# Patient Record
Sex: Female | Born: 1960 | Race: Black or African American | Hispanic: No | Smoking: Never smoker
Health system: Southern US, Community
[De-identification: ages and names within clinical notes are randomized; demographics above are authoritative.]

## PROBLEM LIST (undated history)

## (undated) DIAGNOSIS — Z8739 Personal history of other diseases of the musculoskeletal system and connective tissue: Secondary | ICD-10-CM

## (undated) DIAGNOSIS — K219 Gastro-esophageal reflux disease without esophagitis: Secondary | ICD-10-CM

## (undated) DIAGNOSIS — I10 Essential (primary) hypertension: Secondary | ICD-10-CM

## (undated) DIAGNOSIS — F419 Anxiety disorder, unspecified: Secondary | ICD-10-CM

## (undated) DIAGNOSIS — D649 Anemia, unspecified: Secondary | ICD-10-CM

## (undated) HISTORY — DX: Anemia, unspecified: D64.9

## (undated) HISTORY — PX: TUBAL LIGATION: SHX77

## (undated) HISTORY — DX: Essential (primary) hypertension: I10

## (undated) HISTORY — PX: OTHER SURGICAL HISTORY: SHX169

## (undated) HISTORY — DX: Anxiety disorder, unspecified: F41.9

---

## 2001-04-06 ENCOUNTER — Encounter: Admission: RE | Admit: 2001-04-06 | Discharge: 2001-07-05 | Payer: Self-pay | Admitting: Family Medicine

## 2006-06-02 ENCOUNTER — Ambulatory Visit (HOSPITAL_BASED_OUTPATIENT_CLINIC_OR_DEPARTMENT_OTHER): Admission: RE | Admit: 2006-06-02 | Discharge: 2006-06-02 | Payer: Self-pay | Admitting: Surgery

## 2012-04-17 ENCOUNTER — Other Ambulatory Visit (HOSPITAL_COMMUNITY): Payer: Self-pay | Admitting: Physician Assistant

## 2012-04-17 DIAGNOSIS — Z1231 Encounter for screening mammogram for malignant neoplasm of breast: Secondary | ICD-10-CM

## 2012-04-30 ENCOUNTER — Ambulatory Visit (HOSPITAL_COMMUNITY)
Admission: RE | Admit: 2012-04-30 | Discharge: 2012-04-30 | Disposition: A | Discharge status: Home / Self Care | Payer: Self-pay | Source: Ambulatory Visit | Attending: Physician Assistant | Admitting: Physician Assistant

## 2012-04-30 DIAGNOSIS — Z1231 Encounter for screening mammogram for malignant neoplasm of breast: Secondary | ICD-10-CM

## 2013-04-10 ENCOUNTER — Other Ambulatory Visit (HOSPITAL_COMMUNITY): Payer: Self-pay | Admitting: Physician Assistant

## 2013-04-10 DIAGNOSIS — Z139 Encounter for screening, unspecified: Secondary | ICD-10-CM

## 2013-04-15 ENCOUNTER — Ambulatory Visit (HOSPITAL_COMMUNITY)
Admission: RE | Admit: 2013-04-15 | Discharge: 2013-04-15 | Disposition: A | Discharge status: Home / Self Care | Payer: Self-pay | Source: Ambulatory Visit | Attending: Physician Assistant | Admitting: Physician Assistant

## 2013-04-15 DIAGNOSIS — Z139 Encounter for screening, unspecified: Secondary | ICD-10-CM

## 2013-09-24 ENCOUNTER — Other Ambulatory Visit (HOSPITAL_COMMUNITY): Payer: Self-pay | Admitting: Physician Assistant

## 2013-09-24 DIAGNOSIS — N920 Excessive and frequent menstruation with regular cycle: Secondary | ICD-10-CM

## 2013-09-27 ENCOUNTER — Ambulatory Visit (HOSPITAL_COMMUNITY)
Admission: RE | Admit: 2013-09-27 | Discharge: 2013-09-27 | Disposition: A | Discharge status: Home / Self Care | Payer: Self-pay | Source: Ambulatory Visit | Attending: Physician Assistant | Admitting: Physician Assistant

## 2013-09-27 DIAGNOSIS — N920 Excessive and frequent menstruation with regular cycle: Secondary | ICD-10-CM

## 2013-10-21 ENCOUNTER — Encounter: Payer: Self-pay | Admitting: Obstetrics and Gynecology

## 2013-11-07 ENCOUNTER — Encounter: Payer: Self-pay | Admitting: Obstetrics and Gynecology

## 2013-11-07 ENCOUNTER — Encounter (INDEPENDENT_AMBULATORY_CARE_PROVIDER_SITE_OTHER): Payer: Self-pay

## 2013-11-07 ENCOUNTER — Ambulatory Visit (INDEPENDENT_AMBULATORY_CARE_PROVIDER_SITE_OTHER): Payer: Self-pay | Admitting: Obstetrics and Gynecology

## 2013-11-07 VITALS — BP 178/60 | Ht 66.0 in | Wt 339.4 lb

## 2013-11-07 DIAGNOSIS — D259 Leiomyoma of uterus, unspecified: Secondary | ICD-10-CM

## 2013-11-07 DIAGNOSIS — N925 Other specified irregular menstruation: Secondary | ICD-10-CM

## 2013-11-07 DIAGNOSIS — N938 Other specified abnormal uterine and vaginal bleeding: Secondary | ICD-10-CM

## 2013-11-07 DIAGNOSIS — N949 Unspecified condition associated with female genital organs and menstrual cycle: Secondary | ICD-10-CM

## 2013-11-07 NOTE — Patient Instructions (Addendum)
Stop megace after thanksgivivng Expect a period Return 4 wk for endometrial biopsy Endometrial Biopsy Endometrial biopsy is a procedure in which a tissue sample is taken from inside the uterus. The tissue sample is then looked at under a microscope to see if the tissue is normal or abnormal. The endometrium is the lining of the uterus. This procedure helps determine where you are in your menstrual cycle and how hormone levels are affecting the lining of the uterus. This procedure may also be used to evaluate uterine bleeding or to diagnose endometrial cancer, tuberculosis, polyps, or inflammatory conditions.  LET [facility] CARE PROVIDER KNOW ABOUT:  Any allergies you have.  All medicines you are taking, including vitamins, herbs, eye drops, creams, and over-the-counter medicines.  Previous problems you or members of your family have had with the use of anesthetics.  Any blood disorders you have.  Previous surgeries you have had.  Medical conditions you have.  Possibility of pregnancy. RISKS AND COMPLICATIONS Generally, this is a safe procedure. However, as with any procedure, complications can occur. Possible complications include:  Bleeding.  Pelvic infection.  Puncture of the uterine wall with the biopsy device (rare). BEFORE THE PROCEDURE   Keep a record of your menstrual cycles as directed by your health care provider. You may need to schedule your procedure for a specific time in your cycle.  You may want to bring a sanitary pad to wear home after the procedure.  Arrange for someone to drive you home after the procedure if you will be given a medicine to help you relax (sedative). PROCEDURE   You may be given a sedative to relax you.  You will lie on an exam table with your feet and legs supported as in a pelvic exam.  Your health care provider will insert an instrument (speculum) into your vagina to see your cervix.  Your cervix will be cleansed with an antiseptic  solution. A medicine (local anesthetic) will be used to numb the cervix.  A forceps instrument (tenaculum) will be used to hold your cervix steady for the biopsy.  A thin, rodlike instrument (uterine sound) will be inserted through your cervix to determine the length of your uterus and the location where the biopsy sample will be removed.  A thin, flexible tube (catheter) will be inserted through your cervix and into the uterus. The catheter is used to collect the biopsy sample from your endometrial tissue.  The catheter and speculum will then be removed, and the tissue sample will be sent to a lab for examination. AFTER THE PROCEDURE  You will rest in a recovery area until you are ready to go home.  You may have mild cramping and a small amount of vaginal bleeding for a few days after the procedure. This is normal.  Make sure you find out how to get your test results. Document Released: 04/07/2005 Document Revised: 08/07/2013 Document Reviewed: 05/22/2013 [facility] Patient Information 2014 [place], [place].

## 2013-11-08 NOTE — Progress Notes (Signed)
Subjective:     Patient ID: Renee Vargas, female   DOB: October 09, 1961, 52 y.o.   MRN: [id number]  HPI  Review of Systems U/s from 10/10  CLINICAL DATA: Menorrhagia  EXAM:  TRANSABDOMINAL AND TRANSVAGINAL ULTRASOUND OF PELVIS  TECHNIQUE:  Both transabdominal and transvaginal ultrasound examinations of the  pelvis were performed. Transabdominal technique was performed for  global imaging of the pelvis including uterus, ovaries, adnexal  regions, and pelvic cul-de-sac. It was necessary to proceed with  endovaginal exam following the transabdominal exam to visualize the  endometrium and ovaries.  COMPARISON: None  FINDINGS:  Uterus  Measurements: 13.3 x 9.3 x 9.8 cm. Inhomogeneous in echotexture.  There is masslike enlargement at the uterine fundus with extensive  posterior shadowing that obscures detail. Estimated measurements of  a masslike area at the anterior uterine fundus are 4.9 x 4.6 x 4.3  cm. Probable intramural anterior mid body fibroid measures 2.1 x 1.6  x 1.3 cm. No fibroids or other mass visualized.  Endometrium  Thickness: Obscured by overlying shadowing. No focal abnormality  visualized.  Right ovary  Measurements: 5.7 x 4.6 x 4.1 cm. Not well visualized. Possible cyst  measuring 5.0 x 4.0 x 3.5 cm, internal architecture not  well-defined.  Left ovary  Measurements: Not visualized. No adnexal mass.  Other findings  No free fluid.  IMPRESSION:  Masslike shadowing enlargement of the uterine fundus. This may  represent adenomyosis or fibroids. Leiomyosarcoma could appear  similar. MRI is recommended for further evaluation for this and the  right ovary.  Electronically Signed  By: Christiana Pellant M.D.  On: 09/27/2013 11:37      Objective:   Physical Exam  Constitutional: She is oriented to person, place, and time.  Neck: Normal range of motion.  Cardiovascular: Normal rate.   Pulmonary/Chest: Effort normal.  Genitourinary:  Uterus enlarged,    Musculoskeletal: Normal range of motion.  Neurological: She is alert and oriented to person, place, and time.  Skin: Skin is warm.  Psychiatric: She has a normal mood and affect. Her behavior is normal.       Assessment:     Uterine fibroid     Plan:     Return for endometrial biopsy and discuss options

## 2013-12-05 ENCOUNTER — Telehealth: Payer: Self-pay | Admitting: Obstetrics and Gynecology

## 2013-12-05 NOTE — Telephone Encounter (Signed)
Left msg for pt to keep appointment as scheduled.

## 2013-12-05 NOTE — Telephone Encounter (Signed)
Pt states was given medication at last visit to stop vaginal bleeding, started light bleeding again 12/04/2013. Does pt need to restart medication to stop the bleeding or can she have the endometrial biopsy with vaginal bleeding?

## 2013-12-09 ENCOUNTER — Other Ambulatory Visit: Payer: Self-pay | Admitting: Obstetrics and Gynecology

## 2013-12-09 ENCOUNTER — Ambulatory Visit (INDEPENDENT_AMBULATORY_CARE_PROVIDER_SITE_OTHER): Payer: Self-pay | Admitting: Obstetrics and Gynecology

## 2013-12-09 ENCOUNTER — Encounter: Payer: Self-pay | Admitting: Obstetrics and Gynecology

## 2013-12-09 VITALS — BP 148/90 | Ht 66.0 in | Wt 334.0 lb

## 2013-12-09 DIAGNOSIS — N920 Excessive and frequent menstruation with regular cycle: Secondary | ICD-10-CM

## 2013-12-09 DIAGNOSIS — D259 Leiomyoma of uterus, unspecified: Secondary | ICD-10-CM

## 2013-12-09 DIAGNOSIS — Z3202 Encounter for pregnancy test, result negative: Secondary | ICD-10-CM

## 2013-12-09 LAB — POCT URINE PREGNANCY: Preg Test, Ur: NEGATIVE

## 2013-12-09 MED ORDER — MEGESTROL ACETATE 40 MG PO TABS: 40.0000 mg | ORAL_TABLET | Freq: Every day | ORAL | Status: DC

## 2013-12-09 NOTE — Progress Notes (Deleted)
Patient ID: Renee Vargas, female   DOB: Dec 06, 1961, 52 y.o.   MRN: [id number] . Renee Vargas is an 52 y.o. female.  She is here for endometrial biopsy, and discussion of surgical options re: fibroid enlargement of uterus. Had [facility]  Bleeding in Sept , bleeding thru clothes, placed on provera by Dr Jacquelin Hawking  at [facility].   Now, pt came off pills thanksgiving , had menses last Wednesday, now day 6 ,not heavy.  Pertinent Gynecological History: Menses: controlled by provera since sept menses Bleeding:  Contraception: tubal ligation DES exposure: unknown Blood transfusions: none Sexually transmitted diseases: no past history Previous GYN Procedures: none  Last mammogram: normal Date: 2014 Last pap: normal Date: 2014 at [facility] OB History: G2, P2   Menstrual History: Menarche age: 59  Patient's last menstrual period was 12/04/2013.    Past Medical History  Diagnosis Date  . Hypertension   . Anemia   . Anxiety     Past Surgical History  Procedure Laterality Date  . Tubal ligation    . Fatty mass removed Left     Family History  Problem Relation Age of Onset  . Hypertension Mother   . Hypertension Sister     Social History:  reports that she has never smoked. She has never used smokeless tobacco. She reports that she does not drink alcohol or use illicit drugs.  Allergies: No Known Allergies   (Not in a hospital admission)  ROS  Blood pressure 148/90, height 5\' 6"  (1.676 m), weight 334 lb (151.501 kg), last menstrual period 12/04/2013. Physical Exam  Constitutional: She appears well-developed and well-nourished.  Obese bmi over 50  HENT:  Head: Normocephalic and atraumatic.  Eyes: Pupils are equal, round, and reactive to light.  Neck: Normal range of motion.  Cardiovascular: Normal rate.   Respiratory: Effort normal.  GI: Soft.  Obesity limits exam.. Uterus 16 wk size  Genitourinary: Vagina normal.  Enlarged 16 wk souns to 12 cm on endo  bxEndometrial Biopsy: Patient given informed consent, signed copy in the chart, time out was performed. Time out taken. . The patient was placed in the lithotomy position and the cervix brought into view with sterile speculum.  Portio of cervix cleansed x 2 with betadine swabs.  A tenaculum was placed in the anterior lip of the cervix. The uterus was sounded for depth of *** cm,. Milex uterine Explora 3 mm was introduced to into the uterus, suction created,  and an endometrial sample was obtained. All equipment was removed and accounted for.   The patient tolerated the procedure well.    Patient given post procedure instructions.  Followup: by phone Endometrial Biopsy: Patient given informed consent, signed copy in the chart, time out was performed. Time12:00 out taken. . The patient was placed in the lithotomy position and the cervix brought into view with sterile speculum.  Portio of cervix cleansed x 2 with betadine swabs.  A tenaculum was placed in the anterior lip of the cervix. The uterus was sounded for depth of 12 cm,. Milex uterine Explora 3 mm was introduced to into the uterus, suction created,  and an endometrial sample was obtained. All equipment was removed and accounted for.   The patient tolerated the procedure well.    Patient given post procedure instructions.  Followup: ***     Results for orders placed in visit on 12/09/13 (from the past 24 hour(s))  POCT URINE PREGNANCY     Status: None  Collection Time    12/09/13 11:42 AM      Result Value Range   Preg Test, Ur Negative      No results found.  Assessment/Plan: Uterine fibroids Acute menorrhagia 2 to fibroids, resolved  Followup bx results Will follow menses for now, if pt develops heavy menses, will txs Megace 40 mg tid til bleeding stopped. Given rx for 45 megace 40 mg pills to use as directed.  Vick Filter V 12/09/2013, 11:45 AM

## 2013-12-09 NOTE — Patient Instructions (Signed)
Fibroids Fibroids are lumps (tumors) that can occur any place in a woman's body. These lumps are not cancerous. Fibroids vary in size, weight, and where they grow. HOME CARE  Do not take aspirin.  Write down the number of pads or tampons you use during your period. Tell your doctor. This can help determine the best treatment for you. GET HELP RIGHT AWAY IF:  You have pain in your lower belly (abdomen) that is not helped with medicine.  You have cramps that are not helped with medicine.  You have more bleeding between or during your period.  You feel lightheaded or pass out (faint).  Your lower belly pain gets worse. MAKE SURE YOU:  Understand these instructions.  Will watch your condition.  Will get help right away if you are not doing well or get worse. Document Released: 01/07/2011 Document Revised: 02/27/2012 Document Reviewed: 01/07/2011 [employer] Patient Information 2014 [employer].  

## 2013-12-10 ENCOUNTER — Telehealth: Payer: Self-pay

## 2013-12-10 MED ORDER — MEGESTROL ACETATE 20 MG PO TABS: 40.0000 mg | ORAL_TABLET | Freq: Three times a day (TID) | ORAL | Status: DC

## 2013-12-10 NOTE — Telephone Encounter (Signed)
Pt states Megestrol 20 mg is $5.00 at [facility].  Can Dr. Emelda Fear e-scribed the 20 mg tablet instead of the 40 mg due to cost.

## 2013-12-10 NOTE — Telephone Encounter (Signed)
rx changed to meet pt desire to send to [facility], and adjusted to 20 mg, [facility] tabs tid [facility].

## 2013-12-18 ENCOUNTER — Telehealth: Payer: Self-pay | Admitting: Obstetrics and Gynecology

## 2013-12-18 NOTE — Telephone Encounter (Signed)
Spoke with pt. Had a biopsy last Monday. Was put on Megace for bleeding. Pt states she is still bleeding, but not as bad as it was. What do you advise? Pt concerned that she is still bleeding a week later. Please advise. Thanks!!!

## 2013-12-19 NOTE — Telephone Encounter (Signed)
Call completed 12/19/13

## 2013-12-19 NOTE — Telephone Encounter (Signed)
Patient advised of normal tissue on endometrial biopsy, with "hormone effect" from the megace Pt reports that she is still bleeding lightly despite the megace, so we will d/c the megace, expect bleeding to increase but hopefully resolve within a week. Pt to call us if still bleeding in a week, would at that time Tx with Provera 10 mg x 14 days to provoke menstrual slough of endometrium. Pt is very ,motivated to lose weight before proceeding to hysterectomy, and is joining [employer] in [place]. Pt hopes to put off hyst until at least 10 % weight loss.(wt 330 lb)  F/u prn.

## 2013-12-27 ENCOUNTER — Ambulatory Visit (INDEPENDENT_AMBULATORY_CARE_PROVIDER_SITE_OTHER): Payer: Self-pay | Admitting: Obstetrics and Gynecology

## 2013-12-27 ENCOUNTER — Telehealth: Payer: Self-pay | Admitting: Obstetrics and Gynecology

## 2013-12-27 ENCOUNTER — Encounter: Payer: Self-pay | Admitting: Obstetrics and Gynecology

## 2013-12-27 VITALS — BP 120/76 | Ht 66.0 in | Wt 328.0 lb

## 2013-12-27 DIAGNOSIS — N925 Other specified irregular menstruation: Secondary | ICD-10-CM

## 2013-12-27 DIAGNOSIS — N949 Unspecified condition associated with female genital organs and menstrual cycle: Secondary | ICD-10-CM

## 2013-12-27 DIAGNOSIS — N898 Other specified noninflammatory disorders of vagina: Secondary | ICD-10-CM

## 2013-12-27 DIAGNOSIS — D259 Leiomyoma of uterus, unspecified: Secondary | ICD-10-CM

## 2013-12-27 DIAGNOSIS — N938 Other specified abnormal uterine and vaginal bleeding: Secondary | ICD-10-CM

## 2013-12-27 DIAGNOSIS — N939 Abnormal uterine and vaginal bleeding, unspecified: Secondary | ICD-10-CM

## 2013-12-27 LAB — POCT HEMOGLOBIN: Hemoglobin: 10.7 g/dL — AB (ref 12.2–16.2)

## 2013-12-27 MED ORDER — PHENTERMINE HCL 30 MG PO CAPS: 30.0000 mg | ORAL_CAPSULE | ORAL | Status: DC

## 2013-12-27 MED ORDER — MEGESTROL ACETATE 20 MG PO TABS: 40.0000 mg | ORAL_TABLET | Freq: Three times a day (TID) | ORAL | Status: DC

## 2013-12-27 NOTE — Telephone Encounter (Signed)
Spoke with pt. Pt still bleeding. Was told to stop Megace. Bleeding heavy at times. Appt scheduled for follow up today at 11:15. [place]

## 2013-12-27 NOTE — Patient Instructions (Signed)
Check BP daily when starting Phentermine, do not continue if BP increases Restart megace as we discussed

## 2013-12-27 NOTE — Progress Notes (Signed)
Patient ID: Karolyna A. Saephanh, female   DOB: 03-Dec-1961, 53 y.o.   MRN: [id number] Pt states she is still having the bleeding. Pt states that she has not stopped since 12-04-13.     [place] [facility] Visit  Patient name: Raileigh Sabater. Hassinger MRN [id number]  Date of birth: November 26, 1961  CC & HPI:  Itzamar Traynor. Utt is a 53 y.o. female presenting today for followup of bleeding with known fibroids, [facility].  Pt constantlyhaving to change pads. (cost, inconvenient), gets on her nerves Had endometrial biopsy, with normal result.   ROS:  Bleeding 'getting on nerves' No lightheaded. Husband being supportive , sex not an issue.  Pertinent History Reviewed:  Medical & Surgical Hx:  Reviewed: Significant for  Medications: Reviewed & Updated - see associated section Social History: Reviewed -  reports that she has never smoked. She has never used smokeless tobacco.  Objective Findings:  Vitals: BP 120/76  Ht 5\' 6"  (1.676 m)  Wt 328 lb (148.78 kg)  BMI 52.97 kg/m2  LMP 12/04/2013 hgb 10. 7 Physical Examination: General appearance - alert, well appearing, and in no distress Mental status - alert, oriented to person, place, and time Eyes - pupils equal and reactive, extraocular eye movements intact Abdomen - soft, nontender, nondistended, no masses or organomegaly pelvicl : good support, normal cervix, enlarged uterus 12-14 wk on exam limited by body habitus.        Light spotting present.   Assessment & Plan:   DUB due to a fibroid enlargement of uterus Plan  Wt loss Try to suppress with LUPRON and addback aygestin. Will need to apply for financial aid through company PT needs to be active and wt loss Will offer Phentermine. Pt to watch BP

## 2014-01-03 ENCOUNTER — Telehealth: Payer: Self-pay | Admitting: Obstetrics and Gynecology

## 2014-01-03 NOTE — Telephone Encounter (Signed)
PT calling in regards to an assistance form for the Lupron injection. Pt advised that I would discuss with Dr. Glo Herring as soon as he came in, and that I would call her back as soon as I spoke with him.

## 2014-01-03 NOTE — Telephone Encounter (Signed)
Pt aware form is being filled out and to come by office to fill out her portion and what things to bring with her to complete the application.

## 2014-01-06 ENCOUNTER — Telehealth: Payer: Self-pay | Admitting: Obstetrics and Gynecology

## 2014-01-06 MED ORDER — ESTRADIOL 2 MG PO TABS: 2.0000 mg | ORAL_TABLET | Freq: Two times a day (BID) | ORAL | Status: DC

## 2014-01-06 NOTE — Telephone Encounter (Signed)
PT came by the office and picked up her financial assistance form for her Lupron injection. I spoke with Dr. Glo Herring about the pt still having bleeding after being on megace and he added Estrace 2mg  tablet BID for 10 days. Pt informed of new medication and to continue to take megace 40 mg. Pt was also advised to call the office if the bleeding doesn't stop in a few days or if the bleeding gets worse.Pt verbalized understanding.

## 2014-01-06 NOTE — Telephone Encounter (Signed)
Pt states the megace is not working. Pt supposed to be taking 40mg  3 times daily. Last week pt took 80mg  3 times daily and that still didn't stop bleeding. I advised pt I would send the message to Dr. Glo Herring and ask what he wants to do.

## 2014-01-08 ENCOUNTER — Encounter: Payer: Self-pay | Admitting: *Deleted

## 2014-01-08 NOTE — Progress Notes (Unsigned)
Patient ID: Renee Vargas, female   DOB: [birth year], 53 y.o.   MRN: [id number] Pt came by office Monday and was given the Financial assistance form for her Lupron and she said she would fill out the rest and mail it.

## 2014-01-10 ENCOUNTER — Ambulatory Visit (INDEPENDENT_AMBULATORY_CARE_PROVIDER_SITE_OTHER): Payer: Self-pay | Admitting: Obstetrics and Gynecology

## 2014-01-10 VITALS — BP 120/82 | Ht 66.0 in | Wt 327.0 lb

## 2014-01-10 DIAGNOSIS — N920 Excessive and frequent menstruation with regular cycle: Secondary | ICD-10-CM

## 2014-01-10 LAB — POCT HEMOGLOBIN: Hemoglobin: 8.2 g/dL — AB (ref 12.2–16.2)

## 2014-01-10 MED ORDER — MEGESTROL ACETATE 20 MG PO TABS: 40.0000 mg | ORAL_TABLET | Freq: Three times a day (TID) | ORAL | Status: DC

## 2014-01-10 NOTE — Addendum Note (Signed)
Addended by: Jonnie Kind on: 01/10/2014 01:51 PM   Modules accepted: Orders

## 2014-01-10 NOTE — Progress Notes (Signed)
This chart was transcribed for Dr. Mallory Shirk by Ludger Nutting, ED scribe.    [facility] [facility] Visit  Patient name: Renee Vargas. Renee Vargas MRN [id number]  Date of birth: 1961-10-18  CC & HPI:  Renee Vargas. Depaoli is a 53 y.o. female presenting today for continued vaginal bleeding and Hgb check. She has been vaginally bleeding since December 04, 2013. She reports having no bleeding yesterday but states it returned this morning. She has been complaint with estradiol and Megace. She reports feeling mildly fatigued but is able to perform daily activities.   ROS:  Negative except indicated above.   Pertinent History Reviewed:  Medical & Surgical Hx:  Reviewed: Significant for   Past Medical History  Diagnosis Date  . Hypertension   . Anemia   . Anxiety     Past Surgical History  Procedure Laterality Date  . Tubal ligation    . Fatty mass removed Left     Medications: Reviewed & Updated - see associated section Social History: Reviewed -  reports that she has never smoked. She has never used smokeless tobacco.  Objective Findings:  Vitals: BP 120/82  Ht 5\' 6"  (1.676 m)  Wt 327 lb (148.326 kg)  BMI 52.80 kg/m2  Physical Examination: General appearance - alert, well appearing, and in no distress and oriented to person, place, and time Pelvic - examination not indicated   Assessment & Plan:   Persistent DUB  Uterine fibroids Anemia Plan:  Increase estradiol to 2 mg tid           Continue megace          F/u 5 d      Goal get Lupron approval asap, suppress endometrium til anemia recovers then surgery

## 2014-01-13 ENCOUNTER — Other Ambulatory Visit: Payer: Self-pay | Admitting: Obstetrics and Gynecology

## 2014-01-16 ENCOUNTER — Ambulatory Visit: Payer: Self-pay | Admitting: Obstetrics and Gynecology

## 2014-01-17 ENCOUNTER — Ambulatory Visit: Payer: Self-pay | Admitting: Obstetrics and Gynecology

## 2014-01-22 ENCOUNTER — Encounter: Payer: Self-pay | Admitting: Obstetrics and Gynecology

## 2014-01-22 ENCOUNTER — Ambulatory Visit (INDEPENDENT_AMBULATORY_CARE_PROVIDER_SITE_OTHER): Payer: Self-pay | Admitting: Obstetrics and Gynecology

## 2014-01-22 VITALS — BP 142/92 | Ht 66.0 in | Wt 322.0 lb

## 2014-01-22 DIAGNOSIS — N949 Unspecified condition associated with female genital organs and menstrual cycle: Secondary | ICD-10-CM

## 2014-01-22 DIAGNOSIS — N925 Other specified irregular menstruation: Secondary | ICD-10-CM

## 2014-01-22 DIAGNOSIS — D649 Anemia, unspecified: Secondary | ICD-10-CM

## 2014-01-22 DIAGNOSIS — N938 Other specified abnormal uterine and vaginal bleeding: Secondary | ICD-10-CM

## 2014-01-22 LAB — POCT HEMOGLOBIN: Hemoglobin: 7.9 g/dL — AB (ref 12.2–16.2)

## 2014-01-22 NOTE — Progress Notes (Signed)
This chart was scribed by [name], Medical Scribe, for Dr. Mallory Shirk on 01/22/14 at 11:14 AM. This chart was reviewed by Dr. Mallory Shirk and is accurate.    [facility] Visit  Patient name: Renee Vargas MRN [id number]  Date of birth: [birth year]  CC & HPI:  Renee Vargas is a 53 y.o. female presenting today for signature on surgery paperwork. Reports weight of 322lbs. On megace currently. Was on diet pills but stopped them due to constipation.   ROS:  + ongoing vaginal bleeding, but finally stoppedx 2 wk since using megace and estradiol  Pertinent History Reviewed:  Medical & Surgical Hx:  Reviewed: Significant for fibroid uterus Medications: Reviewed & Updated - see associated section Social History: Reviewed -  reports that she has never smoked. She has never used smokeless tobacco.  Objective Findings:  Vitals: BP 142/92  Ht 5\' 6"  (1.676 m)  Wt 322 lb (146.058 kg)  BMI 52.00 kg/m2  Physical Examination: Not indicated for this visit   Assessment & Plan:  Hg decreasing (7.9 today, down 8.2). Weight loss discussed. Will fax signed LUPRON papers for approval.

## 2014-02-19 ENCOUNTER — Encounter: Payer: Self-pay | Admitting: Obstetrics and Gynecology

## 2014-02-19 ENCOUNTER — Ambulatory Visit (INDEPENDENT_AMBULATORY_CARE_PROVIDER_SITE_OTHER): Payer: Self-pay | Admitting: Obstetrics and Gynecology

## 2014-02-19 VITALS — BP 150/80 | Ht 66.0 in | Wt 330.0 lb

## 2014-02-19 DIAGNOSIS — D219 Benign neoplasm of connective and other soft tissue, unspecified: Secondary | ICD-10-CM | POA: Insufficient documentation

## 2014-02-19 DIAGNOSIS — D259 Leiomyoma of uterus, unspecified: Secondary | ICD-10-CM

## 2014-02-19 DIAGNOSIS — Z3200 Encounter for pregnancy test, result unknown: Secondary | ICD-10-CM

## 2014-02-19 DIAGNOSIS — D649 Anemia, unspecified: Secondary | ICD-10-CM

## 2014-02-19 DIAGNOSIS — N920 Excessive and frequent menstruation with regular cycle: Secondary | ICD-10-CM | POA: Insufficient documentation

## 2014-02-19 DIAGNOSIS — Z3202 Encounter for pregnancy test, result negative: Secondary | ICD-10-CM

## 2014-02-19 LAB — POCT HEMOGLOBIN: Hemoglobin: 8.9 g/dL — AB (ref 12.2–16.2)

## 2014-02-19 LAB — POCT URINE PREGNANCY: Preg Test, Ur: NEGATIVE

## 2014-02-19 MED ORDER — LEUPROLIDE ACETATE (3 MONTH) 11.25 MG IM KIT
11.2500 mg | PACK | Freq: Once | INTRAMUSCULAR | Status: AC
  Administered 2014-02-19: 11.25 mg via INTRAMUSCULAR

## 2014-02-19 NOTE — Patient Instructions (Signed)
Get pap results delivered to our offfice from the [facility] clinic. If no pap on record, will need one prior to surgery

## 2014-02-19 NOTE — Progress Notes (Signed)
This chart was scribed by [name], Medical Scribe, for Dr. Mallory Shirk on 02/19/14 at 10:12 AM. This chart was reviewed by Dr. Mallory Shirk and is accurate.   Patient ID: Renee Vargas, female   DOB: 22-Jun-1961, 53 y.o.   MRN: [id number] Pt given Lupron in right deltoid. Lot number [id number] and exp.date 08/02/16. Pt tolerated well. Pt to discuss surgery with Dr. Glo Herring.    [place] [facility] Visit  Patient name: Renee Vargas. Maclellan MRN [id number]  Date of birth: 02-Jan-1961  CC & HPI:  Renee Vargas. Mullen is a 53 y.o. female presenting today for f/u to vaginal bleeding. Given Lupron injection this morning.  Still on iron pills, megace and estradiol. No megace taken this morning. Makes her feel bloated and is experiencing constipation. + 8 lb weight gain since last visit. Trying activa to help with constipation. Has not been very active due to the constipation.   ROS:  NO vaginal bleeding since January 2015 with megace and estradiol. S/p endometrial BX: BENIGN + 8 lb weight gain + bloating and constipation  Denies any other complaints  Pertinent History Reviewed:  Medical & Surgical Hx:  Reviewed: Significant for uterine fibroids Medications: Reviewed & Updated - see associated section Social History: Reviewed -  reports that she has never smoked. She has never used smokeless tobacco.  Objective Findings:  Vitals: BP 150/80  Ht 5\' 6"  (5.277 m)  Wt 330 lb (149.687 kg)  BMI 53.29 kg/m2  LMP 12/04/2013  Physical Examination: Not indicated for this visit   Assessment & Plan:  Uterine fibroids Menorrhagia  Anemia on Fe PAP needed request results from [place]: surgery planned for early May

## 2014-02-19 NOTE — Progress Notes (Signed)
Scheduled TAH BS per Dr. Glo Herring request for 04-22-14 @ 7:30am.

## 2014-02-24 ENCOUNTER — Telehealth: Payer: Self-pay | Admitting: Adult Health

## 2014-02-25 ENCOUNTER — Encounter: Payer: Self-pay | Admitting: Obstetrics and Gynecology

## 2014-02-25 NOTE — Telephone Encounter (Signed)
Phone call to pt who will begin Megace 40 mg tid til bleeding stops., then daily .

## 2014-02-25 NOTE — Telephone Encounter (Signed)
Pt states was given Lupron injection last week and is now having heavy bleeding were she is having to wear an overnight pad. Pt states was under the understanding that the Lupron was to stop the bleeding. Please advise.

## 2014-02-27 ENCOUNTER — Telehealth: Payer: Self-pay

## 2014-02-27 NOTE — Telephone Encounter (Signed)
Pt states having large clots and bleeding, taking Megace as Dr. Glo Herring prescribed with no improvement. Appointment made tomorrow morning with Dr. Glo Herring.

## 2014-02-28 ENCOUNTER — Ambulatory Visit (INDEPENDENT_AMBULATORY_CARE_PROVIDER_SITE_OTHER): Payer: Self-pay | Admitting: Obstetrics and Gynecology

## 2014-02-28 ENCOUNTER — Encounter: Payer: Self-pay | Admitting: Obstetrics and Gynecology

## 2014-02-28 VITALS — BP 130/84

## 2014-02-28 DIAGNOSIS — D649 Anemia, unspecified: Secondary | ICD-10-CM

## 2014-02-28 DIAGNOSIS — N920 Excessive and frequent menstruation with regular cycle: Secondary | ICD-10-CM | POA: Insufficient documentation

## 2014-02-28 DIAGNOSIS — D250 Submucous leiomyoma of uterus: Secondary | ICD-10-CM

## 2014-02-28 DIAGNOSIS — N939 Abnormal uterine and vaginal bleeding, unspecified: Secondary | ICD-10-CM

## 2014-02-28 DIAGNOSIS — D251 Intramural leiomyoma of uterus: Secondary | ICD-10-CM

## 2014-02-28 LAB — POCT HEMOGLOBIN: Hemoglobin: 8.6 g/dL — AB (ref 12.2–16.2)

## 2014-02-28 NOTE — Progress Notes (Signed)
This chart was scribed by [name], Medical Scribe, for Dr. Mallory Shirk on 02/28/14 at 9:17 AM. This chart was reviewed by Dr. Mallory Shirk and is accurate.    [facility] Visit  Patient name: Renee Vargas. Renee Vargas MRN [id number]  Date of birth: 05-22-1961  CC & HPI:  Renee Vargas is a 53 y.o. female presenting today for worsening of vaginal bleeding after Lupron injection last week. Friday she was wearing a panty liner. Saturday she had to wear a regular pad. Monday she had to wear overnight pads. Tuesday she passed a large clot and was changing pads every 15 minutes. Started on megace 40 mg Monday. Currently, the bleeding is still heavy but improved from Tuesday. +fatigue, no energy. Hgb has also dropped since last visit from 8.9 to 8.6. Was on estradiol but not currently.   ROS:  No vaginal bleeding since January 2015 with megace and estradiol until this past week. S/p endometrial BX: BENIGN +menorrhagia and fatigue Denies any other sxs  Pertinent History Reviewed:  Medical & Surgical Hx:  Reviewed: Significant for fibroid uterus Medications: Reviewed & Updated - see associated section Social History: Reviewed -  reports that she has never smoked. She has never used smokeless tobacco.  Objective Findings:  Vitals: BP 130/84  LMP 02/21/2014 Chaperone present for exam which was done with pt's permission Physical Examination: General appearance - alert, well appearing, and in no distress and oriented to person, place, and time Mental status - alert, oriented to person, place, and time, normal mood, behavior, speech, dress, motor activity, and thought processes Pelvic Exam: Blood present in the vaginal vault, No clots in the vaginal canal  uterus 14 weeks. U/s shows what appears to be a fibroid in endometrial cavity, 2.x 3 cm. No active clots at present.     Assessment & Plan:  A: 1. Uterine fibroids 2. Menorrhagia 3. Anemia on Fe  P: 1. Estradiol 2mg  bid  2.  Stop Megace 3. If bleeding continues thru weekend, will admit for IVpremarin, and move ahead toward hysterectomy ahead of previous schedule.

## 2014-02-28 NOTE — Patient Instructions (Addendum)
Cancel the megace. Take Estrace 2 to 3 times a day until bleeding is controlled. Please call if bleeding worsens. [phone] Emalea Mix  Continue iron Call Monday with report of weekend

## 2014-03-05 ENCOUNTER — Other Ambulatory Visit: Payer: Self-pay | Admitting: Obstetrics and Gynecology

## 2014-03-05 ENCOUNTER — Ambulatory Visit (INDEPENDENT_AMBULATORY_CARE_PROVIDER_SITE_OTHER): Payer: Self-pay | Admitting: Obstetrics and Gynecology

## 2014-03-05 ENCOUNTER — Encounter (HOSPITAL_COMMUNITY): Payer: Self-pay | Admitting: *Deleted

## 2014-03-05 ENCOUNTER — Observation Stay (HOSPITAL_COMMUNITY)
Admission: AD | Admit: 2014-03-05 | Discharge: 2014-03-06 | Disposition: A | Discharge status: Home / Self Care | Payer: Self-pay | Source: Ambulatory Visit | Attending: Obstetrics and Gynecology | Admitting: Obstetrics and Gynecology

## 2014-03-05 VITALS — BP 118/76

## 2014-03-05 DIAGNOSIS — D259 Leiomyoma of uterus, unspecified: Secondary | ICD-10-CM

## 2014-03-05 DIAGNOSIS — N920 Excessive and frequent menstruation with regular cycle: Secondary | ICD-10-CM

## 2014-03-05 DIAGNOSIS — D219 Benign neoplasm of connective and other soft tissue, unspecified: Secondary | ICD-10-CM

## 2014-03-05 DIAGNOSIS — D649 Anemia, unspecified: Secondary | ICD-10-CM | POA: Diagnosis present

## 2014-03-05 LAB — BASIC METABOLIC PANEL
BUN: 13 mg/dL (ref 6–23)
CO2: 23 mEq/L (ref 19–32)
Calcium: 9.1 mg/dL (ref 8.4–10.5)
Chloride: 97 mEq/L (ref 96–112)
Creatinine, Ser: 0.95 mg/dL (ref 0.50–1.10)
GFR calc Af Amer: 80 mL/min — ABNORMAL LOW (ref >90)
GFR calc non Af Amer: 69 mL/min — ABNORMAL LOW (ref >90)
Glucose, Bld: 87 mg/dL (ref 70–99)
Potassium: 3.2 mEq/L — ABNORMAL LOW (ref 3.7–5.3)
Sodium: 134 mEq/L — ABNORMAL LOW (ref 137–147)

## 2014-03-05 LAB — CBC
HCT: 26.9 % — ABNORMAL LOW (ref 36.0–46.0)
Hemoglobin: 8.1 g/dL — ABNORMAL LOW (ref 12.0–15.0)
MCH: 20.6 pg — ABNORMAL LOW (ref 26.0–34.0)
MCHC: 30.1 g/dL (ref 30.0–36.0)
MCV: 68.3 fL — ABNORMAL LOW (ref 78.0–100.0)
Platelets: 579 10*3/uL — ABNORMAL HIGH (ref 150–400)
RBC: 3.94 MIL/uL (ref 3.87–5.11)
RDW: 17.4 % — ABNORMAL HIGH (ref 11.5–15.5)
WBC: 13.1 10*3/uL — ABNORMAL HIGH (ref 4.0–10.5)

## 2014-03-05 LAB — ABO/RH: ABO/RH(D): O POS

## 2014-03-05 LAB — PROTIME-INR
INR: 1.09 (ref 0.00–1.49)
Prothrombin Time: 13.9 seconds (ref 11.6–15.2)

## 2014-03-05 LAB — POCT HEMOGLOBIN: Hemoglobin: 6.7 g/dL — AB (ref 12.2–16.2)

## 2014-03-05 LAB — PREPARE RBC (CROSSMATCH)

## 2014-03-05 MED ORDER — ZOLPIDEM TARTRATE 5 MG PO TABS: 5.0000 mg | ORAL_TABLET | Freq: Every evening | ORAL | Status: DC | PRN

## 2014-03-05 MED ORDER — SODIUM CHLORIDE 0.9 % IV SOLN
INTRAVENOUS | Status: DC
  Administered 2014-03-06: 07:00:00 via INTRAVENOUS

## 2014-03-05 MED ORDER — DIPHENHYDRAMINE HCL 25 MG PO CAPS
50.0000 mg | ORAL_CAPSULE | Freq: Once | ORAL | Status: AC
  Administered 2014-03-05: 50 mg via ORAL
  Filled 2014-03-05: qty 2

## 2014-03-05 MED ORDER — ONDANSETRON HCL 4 MG/2ML IJ SOLN: 4.0000 mg | Freq: Four times a day (QID) | INTRAMUSCULAR | Status: DC | PRN

## 2014-03-05 MED ORDER — ACETAMINOPHEN 325 MG PO TABS
650.0000 mg | ORAL_TABLET | ORAL | Status: DC | PRN
  Administered 2014-03-05: 650 mg via ORAL
  Filled 2014-03-05: qty 2

## 2014-03-05 MED ORDER — OXYCODONE-ACETAMINOPHEN 5-325 MG PO TABS
1.0000 | ORAL_TABLET | ORAL | Status: DC | PRN
  Filled 2014-03-05: qty 1

## 2014-03-05 MED ORDER — ONDANSETRON HCL 4 MG PO TABS: 4.0000 mg | ORAL_TABLET | Freq: Four times a day (QID) | ORAL | Status: DC | PRN

## 2014-03-05 NOTE — H&P (Signed)
  This chart was scribed by [name], Medical Scribe, for Dr. Mallory Shirk on 03/05/14 at 1:55 PM. This chart was reviewed by Dr. Mallory Shirk and is accurate.  Renee Vargas   Patient name: Renee Vargas. [place] MRN [id number] Date of birth: 1961/12/09  CC & HPI:   Renee Vargas. Hogen is a 53 y.o. female presenting today for a hgb check and f/u for vaginal bleeding. Pt was started on estradiol 2 mg BID during her last Vargas and her megace Rx was stopped. She reports that she has had continued bleeding thru the weekend. Hgb at 6.7 today.  ROS:   + menorrhagia  + fatigue  No other complaints  Pertinent History Reviewed:   Medical & Surgical Hx: Reviewed: Significant for anemia on Fe, uterine fibroids and menorrhagia  Medications: Reviewed & Updated - see associated section  Social History: Reviewed - reports that she has never smoked. She has never used smokeless tobacco.  Objective Findings:   Vitals: BP 118/76  LMP 02/21/2014  Physical Examination: Not done  Physical Examination: General appearance - alert, well appearing, and in no distress, oriented to person, place, and time, overweight, acyanotic, in no respiratory distress and anxious Mental status - alert, oriented to person, place, and time, depressed mood Eyes - pupils equal and reactive, extraocular eye movements intact Chest - clear to auscultation, no wheezes, rales or rhonchi, symmetric air entry Heart - normal rate and regular rhythm Abdomen - soft, nontender, nondistended, no masses or organomegaly Fibroid uterus 14-16 wk size on recent exam Pelvic - normal external genitalia, vulva, vagina, cervix, uterus and adnexa, VULVA: normal appearing vulva with no masses, tenderness or lesions, VAGINA: normal appearing vagina with normal color and discharge, no lesions, moderate vag bleeding Extremities - peripheral pulses normal, no pedal edema, no clubbing or cyanosis, no pedal edema noted  Assessment & Plan:   A:   1. Anemia  2. Menorrhagia  3. Uterine fibroids  4. S/p Lupron injection for fibroid supression P:  1.Admission for blood transfusion x3-4 and IV premarin  2. Surgery planned for next week Wednesday.

## 2014-03-05 NOTE — Progress Notes (Signed)
This chart was scribed by [name], Medical Scribe, for Dr. Mallory Shirk on 03/05/14 at 1:55 PM. This chart was reviewed by Dr. Mallory Shirk and is accurate.    [facility] Visit  Patient name: Renee Vargas. Basic MRN [id number]  Date of birth: February 27, 1961  CC & HPI:  Renee Vargas. Treaster is a 53 y.o. female presenting today for a hgb check and f/u for vaginal bleeding. Pt was started on estradiol 2 mg BID during her last visit and her megace Rx was stopped. She reports that she has had continued bleeding thru the weekend.  Hgb at 6.7 today.   ROS:  + menorrhagia + fatigue No other complaints  Pertinent History Reviewed:  Medical & Surgical Hx:  Reviewed: Significant for anemia on Fe, uterine fibroids and menorrhagia Medications: Reviewed & Updated - see associated section Social History: Reviewed -  reports that she has never smoked. She has never used smokeless tobacco.  Objective Findings:  Vitals: BP 118/76  LMP 02/21/2014  Physical Examination: Not done  Assessment & Plan:  A: 1. Anemia 2. Menorrhagia 3. Uterine fibroids   P: 1.Admission for blood transfusion x3-4  and IV premarin 2. Surgery planned for next week

## 2014-03-06 ENCOUNTER — Ambulatory Visit: Payer: Self-pay | Admitting: Obstetrics and Gynecology

## 2014-03-06 ENCOUNTER — Encounter (HOSPITAL_COMMUNITY): Payer: Self-pay

## 2014-03-06 LAB — HEMOGLOBIN AND HEMATOCRIT, BLOOD
HCT: 32.2 % — ABNORMAL LOW (ref 36.0–46.0)
Hemoglobin: 10.3 g/dL — ABNORMAL LOW (ref 12.0–15.0)

## 2014-03-06 LAB — TSH: TSH: 2.228 u[IU]/mL (ref 0.350–4.500)

## 2014-03-06 MED ORDER — PROMETHAZINE HCL 25 MG/ML IJ SOLN
12.5000 mg | INTRAMUSCULAR | Status: DC | PRN
  Filled 2014-03-06: qty 1

## 2014-03-06 MED ORDER — ESTRADIOL 2 MG PO TABS: 2.0000 mg | ORAL_TABLET | Freq: Two times a day (BID) | ORAL | Status: DC

## 2014-03-06 MED ORDER — ESTROGENS CONJUGATED 25 MG IJ SOLR
25.0000 mg | INTRAMUSCULAR | Status: AC
  Administered 2014-03-06 (×2): 25 mg via INTRAVENOUS
  Filled 2014-03-06 (×2): qty 25

## 2014-03-06 MED ORDER — PROMETHAZINE HCL 25 MG/ML IJ SOLN: 12.5000 mg | Freq: Once | INTRAMUSCULAR | Status: DC

## 2014-03-06 MED ORDER — ESTROGENS CONJUGATED 25 MG IJ SOLR: 25.0000 mg | INTRAMUSCULAR | Status: DC

## 2014-03-06 NOTE — Discharge Summary (Signed)
Physician Discharge Summary  Patient ID: Renee Vargas MRN: [id number] DOB/AGE: Aug 03, 1961 53 y.o.  Admit date: 03/05/2014 Discharge date: 03/06/2014  Admission Diagnoses:1. Anemia  2. Menorrhagia  3. Uterine fibroids  4. S/p Lupron injection for fibroid supression   Discharge Diagnoses: SAME , ANEMIA, CORRECTED Active Problems:   Anemia   Discharged Condition: good  Hospital Course: aDMITTED FOR TRANSFUSIONS, hGB 8 ON ADMIT, RECEIVED 3 UNITS WITH HGB 10+ after 3rd unit. Also given premarin IV, 25 mg x 2 doses. Will coordinate surgery for next week  Consults: None  Significant Diagnostic Studies: labs:  CBC    Component Value Date/Time   WBC 13.1* 03/05/2014 1841   RBC 3.94 03/05/2014 1841   HGB 10.3* 03/06/2014 1147   HGB 6.7* 03/05/2014 1341   HCT 32.2* 03/06/2014 1147   PLT 579* 03/05/2014 1841   MCV 68.3* 03/05/2014 1841   MCH 20.6* 03/05/2014 1841   MCHC 30.1 03/05/2014 1841   RDW 17.4* 03/05/2014 1841      Treatments: transfusions 3 units prbc  Discharge Exam: Blood pressure 121/76, pulse 74, temperature 98.3 F (36.8 C), temperature source Oral, resp. rate 19, height 5\' 6"  (1.676 m), weight 315 lb (142.883 kg), last menstrual period 02/21/2014, SpO2 99.00%. General appearance: alert, appears stated age and no distress GI: abnormal findings:  morbid obese  Disposition: Final discharge disposition not confirmed  Discharge Orders   Future Appointments Provider Department Dept Phone   03/06/2014 1:15 PM Jonnie Kind, MD [facility] [phone]   03/10/2014 8:00 AM [facility]Do[facility] Delton [place]ombes [facility] [phone]   03/20/2014 11:30 AM Jonnie Kind, MD [facility] [phone]   Future Orders Complete By Expires   Call MD for:  As directed    Scheduling Instructions:     Recurrence of bleeding heavy as a period   Comments:     Recurrent bleeding equal to a period   Diet - low sodium heart healthy  As directed    Discharge  instructions  As directed    Comments:     You will  Need to renew the crossmatch and recheck hgb on Monday or Tuesday. You should receive a call by Friday to schedule the preop visit.   Increase activity slowly  As directed        Medication List    STOP taking these medications       megestrol 20 MG tablet  Commonly known as:  MEGACE      TAKE these medications       ALPRAZolam 0.5 MG tablet  Commonly known as:  XANAX  Take 0.5 mg by mouth 3 (three) times daily as needed for anxiety.     amLODipine 5 MG tablet  Commonly known as:  NORVASC  Take 5 mg by mouth daily.     citalopram 40 MG tablet  Commonly known as:  CELEXA  Take 40 mg by mouth daily.     estradiol 2 MG tablet  Commonly known as:  ESTRACE  Take 1 tablet (2 mg total) by mouth 2 (two) times daily.     ferrous sulfate 325 (65 FE) MG tablet  Take 325 mg by mouth daily with breakfast.     phentermine 30 MG capsule  Take 1 capsule (30 mg total) by mouth every morning.     triamterene-hydrochlorothiazide 37.5-25 MG per tablet  Commonly known as:  MAXZIDE-25  Take 1 tablet by mouth daily.  SignedJonnie Kind 03/06/2014, 12:58 PM

## 2014-03-06 NOTE — H&P (Signed)
This chart was scribed by Christina Taylor, Medical Scribe, for Dr. Deana Krock on 03/05/14 at 1:55 PM. This chart was reviewed by Dr. Almalik Weissberg and is accurate.   [facility] Visit   Patient name: Renee Vargas MRN [id number] Date of birth: 03/04/1961   CC & HPI:   Renee Vargas is a 52 y.o. female presenting today for a hgb check and f/u for vaginal bleeding. Pt was started on estradiol 2 mg BID during her last visit and her megace Rx was stopped. She reports that she has had continued bleeding thru the weekend. Hgb at 6.7 today.   ROS:   + menorrhagia  + fatigue  No other complaints   Pertinent History Reviewed:   Medical & Surgical Hx: Reviewed: Significant for anemia on Fe, uterine fibroids and menorrhagia  Medications: Reviewed & Updated - see associated section  Social History: Reviewed - reports that she has never smoked. She has never used smokeless tobacco.   Objective Findings:   Vitals: BP 118/76  LMP 02/21/2014  Physical Examination: Not done  Physical Examination: General appearance - alert, well appearing, and in no distress, oriented to person, place, and time, overweight, acyanotic, in no respiratory distress and anxious  Mental status - alert, oriented to person, place, and time, depressed mood  Eyes - pupils equal and reactive, extraocular eye movements intact  Chest - clear to auscultation, no wheezes, rales or rhonchi, symmetric air entry  Heart - normal rate and regular rhythm  Abdomen - soft, nontender, nondistended, no masses or organomegaly  Fibroid uterus 14-16 wk size on recent exam  Pelvic - normal external genitalia, vulva, vagina, cervix, uterus and adnexa, VULVA: normal appearing vulva with no masses, tenderness or lesions, VAGINA: normal appearing vagina with normal color and discharge, no lesions, moderate vag bleeding  Extremities - peripheral pulses normal, no pedal edema, no clubbing or cyanosis, no pedal edema noted   Assessment &  Plan:   A:  1. Anemia  2. Menorrhagia  3. Uterine fibroids  4. S/p Lupron injection for fibroid supression  P:  1.Admission for blood transfusion x3-4 and IV premarin  2. Surgery planned for next week Wednesday.          

## 2014-03-06 NOTE — Progress Notes (Signed)
Patient with orders to be discharge home. Discharge instructions given, patient verbalized understanding. Patient stable. Patient left inj private vehicle with spouse.

## 2014-03-06 NOTE — Care Management Note (Signed)
UR completed 

## 2014-03-09 LAB — TYPE AND SCREEN
ABO/RH(D): O POS
Antibody Screen: NEGATIVE
Unit division: 0
Unit division: 0
Unit division: 0
Unit division: 0

## 2014-03-10 ENCOUNTER — Encounter (HOSPITAL_COMMUNITY): Payer: Self-pay

## 2014-03-10 ENCOUNTER — Encounter (HOSPITAL_COMMUNITY)
Admission: RE | Admit: 2014-03-10 | Discharge: 2014-03-10 | Disposition: A | Discharge status: Home / Self Care | Payer: Self-pay | Source: Ambulatory Visit | Attending: Obstetrics and Gynecology | Admitting: Obstetrics and Gynecology

## 2014-03-10 ENCOUNTER — Other Ambulatory Visit: Payer: Self-pay | Admitting: Obstetrics and Gynecology

## 2014-03-10 DIAGNOSIS — Z01810 Encounter for preprocedural cardiovascular examination: Secondary | ICD-10-CM | POA: Insufficient documentation

## 2014-03-10 DIAGNOSIS — Z01812 Encounter for preprocedural laboratory examination: Secondary | ICD-10-CM | POA: Insufficient documentation

## 2014-03-10 DIAGNOSIS — Z01818 Encounter for other preprocedural examination: Secondary | ICD-10-CM

## 2014-03-10 HISTORY — DX: Personal history of other diseases of the musculoskeletal system and connective tissue: Z87.39

## 2014-03-10 LAB — PREPARE RBC (CROSSMATCH)

## 2014-03-10 LAB — URINE MICROSCOPIC-ADD ON

## 2014-03-10 LAB — CBC
HCT: 34.4 % — ABNORMAL LOW (ref 36.0–46.0)
Hemoglobin: 10.8 g/dL — ABNORMAL LOW (ref 12.0–15.0)
MCH: 23.1 pg — ABNORMAL LOW (ref 26.0–34.0)
MCHC: 31.4 g/dL (ref 30.0–36.0)
MCV: 73.5 fL — ABNORMAL LOW (ref 78.0–100.0)
Platelets: 523 10*3/uL — ABNORMAL HIGH (ref 150–400)
RBC: 4.68 MIL/uL (ref 3.87–5.11)
RDW: 21.9 % — ABNORMAL HIGH (ref 11.5–15.5)
WBC: 10.2 10*3/uL (ref 4.0–10.5)

## 2014-03-10 LAB — COMPREHENSIVE METABOLIC PANEL
ALT: 39 U/L — ABNORMAL HIGH (ref 0–35)
AST: 36 U/L (ref 0–37)
Albumin: 3.1 g/dL — ABNORMAL LOW (ref 3.5–5.2)
Alkaline Phosphatase: 97 U/L (ref 39–117)
BUN: 12 mg/dL (ref 6–23)
CO2: 25 mEq/L (ref 19–32)
Calcium: 8.9 mg/dL (ref 8.4–10.5)
Chloride: 98 mEq/L (ref 96–112)
Creatinine, Ser: 0.96 mg/dL (ref 0.50–1.10)
GFR calc Af Amer: 79 mL/min — ABNORMAL LOW (ref >90)
GFR calc non Af Amer: 68 mL/min — ABNORMAL LOW (ref >90)
Glucose, Bld: 91 mg/dL (ref 70–99)
Potassium: 3.8 mEq/L (ref 3.7–5.3)
Sodium: 135 mEq/L — ABNORMAL LOW (ref 137–147)
Total Bilirubin: 0.3 mg/dL (ref 0.3–1.2)
Total Protein: 7.5 g/dL (ref 6.0–8.3)

## 2014-03-10 LAB — RPR: RPR Ser Ql: NONREACTIVE

## 2014-03-10 LAB — URINALYSIS, ROUTINE W REFLEX MICROSCOPIC
Bilirubin Urine: NEGATIVE
Glucose, UA: NEGATIVE mg/dL
Hgb urine dipstick: NEGATIVE
Ketones, ur: NEGATIVE mg/dL
Nitrite: NEGATIVE
Protein, ur: NEGATIVE mg/dL
Specific Gravity, Urine: 1.015 (ref 1.005–1.030)
Urobilinogen, UA: 0.2 mg/dL (ref 0.0–1.0)
pH: 6 (ref 5.0–8.0)

## 2014-03-10 LAB — HCG, SERUM, QUALITATIVE: Preg, Serum: NEGATIVE

## 2014-03-10 NOTE — Telephone Encounter (Signed)
Pt spoke with Caryl Pina

## 2014-03-10 NOTE — Patient Instructions (Addendum)
Renee Vargas  03/10/2014   Your procedure is scheduled on:  03/12/2014  Report to [facility] at  1045  AM.  Call this number if you have problems the morning of surgery: [phone]   Remember:   Do not eat food or drink liquids after midnight.   Take these medicines the morning of surgery with A SIP OF WATER:  Norvasc, celexa, maxzide   Do not wear jewelry, make-up or nail polish.  Do not wear lotions, powders, or perfumes.   Do not shave 48 hours prior to surgery. Men may shave face and neck.  Do not bring valuables to the hospital.  [facility] is not responsible   for any belongings or valuables.               Contacts, dentures or bridgework may not be worn into surgery.  Leave suitcase in the car. After surgery it may be brought to your room.  For patients admitted to the hospital, discharge time is determined by your  treatment team.               Patients discharged the day of surgery will not be allowed to drive home.  Name and phone number of your driver: family  Special Instructions: Shower using CHG 2 nights before surgery and the night before surgery.  If you shower the day of surgery use CHG.  Use special wash - you have one bottle of CHG for all showers.  You should use approximately 1/3 of the bottle for each shower.   Please read over the following fact sheets that you were given: Pain Booklet, Coughing and Deep Breathing, Surgical Site Infection Prevention, Anesthesia Post-op Instructions and Care and Recovery After Surgery Bilateral Salpingo-Oophorectomy Bilateral salpingo-oophorectomy is the surgical removal of both fallopian tubes and both ovaries. The ovaries are small organs that produce eggs in women. The fallopian tubes transport the egg from the ovary to the womb (uterus). Usually, when this surgery is done, the uterus was previously removed. A bilateral salpingo-oophorectomy may be done to treat cancer or to reduce the risk of cancer in women who  are at high risk. Removing both fallopian tubes and both ovaries will make you unable to become pregnant (sterile). It will also put you into menopause so that you will no longer have menstrual periods and may have menopausal symptoms such as hot flashes, night sweats, and mood changes. It will not affect your sex drive. LET [facility] CARE PROVIDER KNOW ABOUT:  Any allergies you have.  All medicines you are taking, including vitamins, herbs, eye drops, creams, and over-the-counter medicines.  Previous problems you or members of your family have had with the use of anesthetics.  Any blood disorders you have.  Previous surgeries you have had.  Medical conditions you have. RISKS AND COMPLICATIONS Generally, this is a safe procedure. However, as with any procedure, complications can occur. Possible complications include:  Injury to surrounding organs.  Bleeding.  Infection.  Blood clots in the legs or lungs.  Problems related to anesthesia. BEFORE THE PROCEDURE  Ask your health care provider about changing or stopping your regular medicines. You may need to stop taking certain medicines, such as aspirin or blood thinners, at least 1 week before the surgery.  Do not eat or drink anything for at least 8 hours before the surgery.  If you smoke, do not smoke for at least 2 weeks before the surgery.  Make  plans to have someone drive you home after the procedure or after your hospital stay. Also arrange for someone to help you with activities during recovery. PROCEDURE   You will be given medicine to help you relax before the procedure (sedative). You will then be given medicine to make you sleep through the procedure (general anesthetic). These medicines will be given through an IV access tube that is put into one of your veins.  Once you are asleep, your lower abdomen will be shaved and cleaned. A thin, flexible tube (catheter) will be placed in your bladder.  The surgeon may use  a laparoscopic, robotic, or open technique for this surgery:  In the laparoscopic technique, the surgery is done through two small cuts (incisions) in the abdomen. A thin, lighted tube with a tiny camera on the end (laparoscope) is inserted into one of the incisions. The tools needed for the procedure are put through the other incision.  A robotic technique may be chosen to perform complex surgery in a small space. In the robotic technique, small incisions will be made. A camera and surgical instruments are passed through the incisions. Surgical instruments will be controlled with the help of a robotic arm.  In the open technique, the surgery is done through one large incision in the abdomen.  Using any of these techniques, the surgeon removes the fallopian tubes and ovaries. The blood vessels will be clamped and tied.  The surgeon then uses staples or stitches to close the incision or incisions. AFTER THE PROCEDURE  You will be taken to a recovery area where you will be monitored for 1 to 3 hours. Your blood pressure, pulse, and temperature will be checked often. You will remain in the recovery area until you are stable and waking up.  If the laparoscopic technique was used, you may be allowed to go home after several hours. You may have some shoulder pain after the laparoscopic procedure. This is normal and usually goes away in a day or two.  If the open technique was used, you will be admitted to the hospital for a couple of days.  You will be given pain medicine as needed.  The IV access tube and catheter will be removed before you are discharged. Document Released: 12/05/2005 Document Revised: 08/07/2013 Document Reviewed: 05/29/2013 [facility] Patient Information 2014 [place]. Hysterectomy Information  A hysterectomy is a surgery in which your uterus is removed. This surgery may be done to treat various medical problems. After the surgery, you will no longer have menstrual  periods. The surgery will also make you unable to become pregnant (sterile). The fallopian tubes and ovaries can be removed (bilateral salpingo-oophorectomy) during this surgery as well.  REASONS FOR A HYSTERECTOMY  Persistent, abnormal bleeding.  Lasting (chronic) pelvic pain or infection.  The lining of the uterus (endometrium) starts growing outside the uterus (endometriosis).  The endometrium starts growing in the muscle of the uterus (adenomyosis).  The uterus falls down into the vagina (pelvic organ prolapse).  Noncancerous growths in the uterus (uterine fibroids) that cause symptoms.  Precancerous cells.  Cervical cancer or uterine cancer. TYPES OF HYSTERECTOMIES  Supracervical hysterectomy In this type, the top part of the uterus is removed, but not the cervix.  Total hysterectomy The uterus and cervix are removed.  Radical hysterectomy The uterus, the cervix, and the fibrous tissue that holds the uterus in place in the pelvis (parametrium) are removed. WAYS A HYSTERECTOMY CAN BE PERFORMED  Abdominal hysterectomy A large surgical  cut (incision) is made in the abdomen. The uterus is removed through this incision.  Vaginal hysterectomy An incision is made in the vagina. The uterus is removed through this incision. There are no abdominal incisions.  Conventional laparoscopic hysterectomy Three or four small incisions are made in the abdomen. A thin, lighted tube with a camera (laparoscope) is inserted into one of the incisions. Other tools are put through the other incisions. The uterus is cut into small pieces. The small pieces are removed through the incisions, or they are removed through the vagina.  Laparoscopically assisted vaginal hysterectomy (LAVH) Three or four small incisions are made in the abdomen. Part of the surgery is performed laparoscopically and part vaginally. The uterus is removed through the vagina.  Robot-assisted laparoscopic hysterectomy A laparoscope  and other tools are inserted into 3 or 4 small incisions in the abdomen. A computer-controlled device is used to give the surgeon a 3D image and to help control the surgical instruments. This allows for more precise movements of surgical instruments. The uterus is cut into small pieces and removed through the incisions or removed through the vagina. RISKS AND COMPLICATIONS  Possible complications associated with this procedure include:  Bleeding and risk of blood transfusion. Tell your health care provider if you do not want to receive any blood products.  Blood clots in the legs or lung.  Infection.  Injury to surrounding organs.  Problems or side effects related to anesthesia.  Conversion to an abdominal hysterectomy from one of the other techniques. WHAT TO EXPECT AFTER A HYSTERECTOMY  You will be given pain medicine.  You will need to have someone with you for the first 3 5 days after you go home.  You will need to follow up with your surgeon in 2 4 weeks after surgery to evaluate your progress.  You may have early menopause symptoms such as hot flashes, night sweats, and insomnia.  If you had a hysterectomy for a problem that was not cancer or not a condition that could lead to cancer, then you no longer need Pap tests. However, even if you no longer need a Pap test, a regular exam is a good idea to make sure no other problems are starting. Document Released: 05/31/2001 Document Revised: 09/25/2013 Document Reviewed: 08/12/2013 [facility] Patient Information 2014 [place]. PATIENT INSTRUCTIONS POST-ANESTHESIA  IMMEDIATELY FOLLOWING SURGERY:  Do not drive or operate machinery for the first twenty four hours after surgery.  Do not make any important decisions for twenty four hours after surgery or while taking narcotic pain medications or sedatives.  If you develop intractable nausea and vomiting or a severe headache please notify your doctor immediately.  FOLLOW-UP:  Please  make an appointment with your surgeon as instructed. You do not need to follow up with anesthesia unless specifically instructed to do so.  WOUND CARE INSTRUCTIONS (if applicable):  Keep a dry clean dressing on the anesthesia/puncture wound site if there is drainage.  Once the wound has quit draining you may leave it open to air.  Generally you should leave the bandage intact for twenty four hours unless there is drainage.  If the epidural site drains for more than 36-48 hours please call the anesthesia department.  QUESTIONS?:  Please feel free to call your physician or the hospital operator if you have any questions, and they will be happy to assist you.       Drink 1 bottle of Magnesium Citrate at noon on 03/11/2014. Drink plenty of fluids  behind this to make laxative work more effectively. Once you drink the laxative, only full liquids after this. NO SOLID FOOD. Liquids include jello, popsicles, broth. Any type of liquid except for milk or milk products.

## 2014-03-12 ENCOUNTER — Inpatient Hospital Stay (HOSPITAL_COMMUNITY)
Admission: RE | Admit: 2014-03-12 | Discharge: 2014-03-14 | DRG: 742 | Disposition: A | Discharge status: Home / Self Care | Payer: Self-pay | Source: Ambulatory Visit | Attending: Obstetrics and Gynecology | Admitting: Obstetrics and Gynecology

## 2014-03-12 ENCOUNTER — Encounter (HOSPITAL_COMMUNITY): Payer: Self-pay | Admitting: Anesthesiology

## 2014-03-12 ENCOUNTER — Encounter (HOSPITAL_COMMUNITY)
Admission: RE | Disposition: A | Discharge status: Home / Self Care | Payer: Self-pay | Source: Ambulatory Visit | Attending: Obstetrics and Gynecology

## 2014-03-12 ENCOUNTER — Inpatient Hospital Stay (HOSPITAL_COMMUNITY): Payer: Self-pay | Admitting: Anesthesiology

## 2014-03-12 ENCOUNTER — Encounter (HOSPITAL_COMMUNITY): Payer: Self-pay | Admitting: *Deleted

## 2014-03-12 DIAGNOSIS — D500 Iron deficiency anemia secondary to blood loss (chronic): Secondary | ICD-10-CM

## 2014-03-12 DIAGNOSIS — Z6841 Body Mass Index (BMI) 40.0 and over, adult: Secondary | ICD-10-CM

## 2014-03-12 DIAGNOSIS — Z90711 Acquired absence of uterus with remaining cervical stump: Secondary | ICD-10-CM | POA: Diagnosis present

## 2014-03-12 DIAGNOSIS — N840 Polyp of corpus uteri: Secondary | ICD-10-CM

## 2014-03-12 DIAGNOSIS — D649 Anemia, unspecified: Secondary | ICD-10-CM | POA: Diagnosis present

## 2014-03-12 DIAGNOSIS — N920 Excessive and frequent menstruation with regular cycle: Secondary | ICD-10-CM | POA: Diagnosis present

## 2014-03-12 DIAGNOSIS — N83209 Unspecified ovarian cyst, unspecified side: Secondary | ICD-10-CM | POA: Diagnosis present

## 2014-03-12 DIAGNOSIS — I10 Essential (primary) hypertension: Secondary | ICD-10-CM | POA: Diagnosis present

## 2014-03-12 DIAGNOSIS — D259 Leiomyoma of uterus, unspecified: Principal | ICD-10-CM | POA: Diagnosis present

## 2014-03-12 DIAGNOSIS — N9489 Other specified conditions associated with female genital organs and menstrual cycle: Secondary | ICD-10-CM

## 2014-03-12 DIAGNOSIS — D251 Intramural leiomyoma of uterus: Secondary | ICD-10-CM

## 2014-03-12 HISTORY — PX: ABDOMINAL HYSTERECTOMY: SHX81

## 2014-03-12 HISTORY — PX: SALPINGOOPHORECTOMY: SHX82

## 2014-03-12 SURGERY — HYSTERECTOMY, ABDOMINAL
Anesthesia: General

## 2014-03-12 MED ORDER — SIMETHICONE 80 MG PO CHEW: 80.0000 mg | CHEWABLE_TABLET | Freq: Four times a day (QID) | ORAL | Status: DC | PRN

## 2014-03-12 MED ORDER — HYDROMORPHONE 0.3 MG/ML IV SOLN
INTRAVENOUS | Status: DC
  Administered 2014-03-12: 17:00:00 via INTRAVENOUS
  Administered 2014-03-12: 3.6 mg via INTRAVENOUS
  Administered 2014-03-13 (×2): 0.6 mg via INTRAVENOUS
  Filled 2014-03-12: qty 25

## 2014-03-12 MED ORDER — KETOROLAC TROMETHAMINE 30 MG/ML IJ SOLN
30.0000 mg | Freq: Four times a day (QID) | INTRAMUSCULAR | Status: AC
  Administered 2014-03-13 – 2014-03-14 (×5): 30 mg via INTRAVENOUS
  Filled 2014-03-12 (×2): qty 1

## 2014-03-12 MED ORDER — CEFAZOLIN SODIUM-DEXTROSE 2-3 GM-% IV SOLR
INTRAVENOUS | Status: AC
  Filled 2014-03-12: qty 50

## 2014-03-12 MED ORDER — ACETAMINOPHEN 325 MG PO TABS: 650.0000 mg | ORAL_TABLET | ORAL | Status: DC | PRN

## 2014-03-12 MED ORDER — AMLODIPINE BESYLATE 5 MG PO TABS
5.0000 mg | ORAL_TABLET | Freq: Every day | ORAL | Status: DC
  Administered 2014-03-13 – 2014-03-14 (×2): 5 mg via ORAL
  Filled 2014-03-12 (×2): qty 1

## 2014-03-12 MED ORDER — ONDANSETRON HCL 4 MG/2ML IJ SOLN: 4.0000 mg | Freq: Four times a day (QID) | INTRAMUSCULAR | Status: DC | PRN

## 2014-03-12 MED ORDER — NALOXONE HCL 0.4 MG/ML IJ SOLN: 0.4000 mg | INTRAMUSCULAR | Status: DC | PRN

## 2014-03-12 MED ORDER — KETOROLAC TROMETHAMINE 30 MG/ML IJ SOLN
30.0000 mg | Freq: Four times a day (QID) | INTRAMUSCULAR | Status: AC
  Filled 2014-03-12 (×3): qty 1

## 2014-03-12 MED ORDER — ONDANSETRON HCL 4 MG/2ML IJ SOLN: 4.0000 mg | Freq: Once | INTRAMUSCULAR | Status: DC | PRN

## 2014-03-12 MED ORDER — ROCURONIUM BROMIDE 100 MG/10ML IV SOLN
INTRAVENOUS | Status: DC | PRN
  Administered 2014-03-12: 35 mg via INTRAVENOUS
  Administered 2014-03-12 (×2): 5 mg via INTRAVENOUS

## 2014-03-12 MED ORDER — GLYCOPYRROLATE 0.2 MG/ML IJ SOLN
INTRAMUSCULAR | Status: DC | PRN
  Administered 2014-03-12: .8 mg via INTRAVENOUS

## 2014-03-12 MED ORDER — PROPOFOL 10 MG/ML IV BOLUS
INTRAVENOUS | Status: DC | PRN
  Administered 2014-03-12: 200 mg via INTRAVENOUS

## 2014-03-12 MED ORDER — GLYCOPYRROLATE 0.2 MG/ML IJ SOLN
0.2000 mg | Freq: Once | INTRAMUSCULAR | Status: AC
  Administered 2014-03-12: 0.2 mg via INTRAVENOUS

## 2014-03-12 MED ORDER — FENTANYL CITRATE 0.05 MG/ML IJ SOLN
INTRAMUSCULAR | Status: AC
  Filled 2014-03-12: qty 5

## 2014-03-12 MED ORDER — OXYCODONE-ACETAMINOPHEN 5-325 MG PO TABS
2.0000 | ORAL_TABLET | ORAL | Status: DC | PRN
  Administered 2014-03-14: 2 via ORAL
  Filled 2014-03-12: qty 2

## 2014-03-12 MED ORDER — KETOROLAC TROMETHAMINE 30 MG/ML IJ SOLN
30.0000 mg | Freq: Once | INTRAMUSCULAR | Status: AC
  Administered 2014-03-12: 30 mg via INTRAVENOUS

## 2014-03-12 MED ORDER — CITALOPRAM HYDROBROMIDE 20 MG PO TABS
40.0000 mg | ORAL_TABLET | Freq: Every day | ORAL | Status: DC
  Administered 2014-03-13 – 2014-03-14 (×2): 40 mg via ORAL
  Filled 2014-03-12 (×2): qty 2

## 2014-03-12 MED ORDER — LACTATED RINGERS IV SOLN
INTRAVENOUS | Status: DC
  Administered 2014-03-12 (×3): via INTRAVENOUS

## 2014-03-12 MED ORDER — DEXTROSE 5 % IV SOLN: 3.0000 g | INTRAVENOUS | Status: DC

## 2014-03-12 MED ORDER — FENTANYL CITRATE 0.05 MG/ML IJ SOLN
INTRAMUSCULAR | Status: DC | PRN
  Administered 2014-03-12 (×2): 50 ug via INTRAVENOUS
  Administered 2014-03-12: 100 ug via INTRAVENOUS
  Administered 2014-03-12 (×6): 50 ug via INTRAVENOUS

## 2014-03-12 MED ORDER — PROPOFOL 10 MG/ML IV BOLUS
INTRAVENOUS | Status: AC
  Filled 2014-03-12: qty 20

## 2014-03-12 MED ORDER — MIDAZOLAM HCL 2 MG/2ML IJ SOLN
INTRAMUSCULAR | Status: AC
  Filled 2014-03-12: qty 2

## 2014-03-12 MED ORDER — MIDAZOLAM HCL 2 MG/2ML IJ SOLN
1.0000 mg | INTRAMUSCULAR | Status: DC | PRN
  Administered 2014-03-12 (×2): 2 mg via INTRAVENOUS
  Filled 2014-03-12: qty 2

## 2014-03-12 MED ORDER — FENTANYL CITRATE 0.05 MG/ML IJ SOLN
INTRAMUSCULAR | Status: AC
  Filled 2014-03-12: qty 2

## 2014-03-12 MED ORDER — LIDOCAINE HCL (PF) 1 % IJ SOLN
INTRAMUSCULAR | Status: AC
  Filled 2014-03-12: qty 5

## 2014-03-12 MED ORDER — FENTANYL CITRATE 0.05 MG/ML IJ SOLN
25.0000 ug | INTRAMUSCULAR | Status: DC | PRN
  Administered 2014-03-12 (×4): 50 ug via INTRAVENOUS

## 2014-03-12 MED ORDER — SUCCINYLCHOLINE CHLORIDE 20 MG/ML IJ SOLN
INTRAMUSCULAR | Status: DC | PRN
  Administered 2014-03-12: 120 mg via INTRAVENOUS

## 2014-03-12 MED ORDER — NEOSTIGMINE METHYLSULFATE 1 MG/ML IJ SOLN
INTRAMUSCULAR | Status: DC | PRN
  Administered 2014-03-12: 4 mg via INTRAVENOUS

## 2014-03-12 MED ORDER — CEFAZOLIN SODIUM 1-5 GM-% IV SOLN
INTRAVENOUS | Status: AC
  Filled 2014-03-12: qty 50

## 2014-03-12 MED ORDER — PANTOPRAZOLE SODIUM 40 MG PO TBEC
40.0000 mg | DELAYED_RELEASE_TABLET | Freq: Every day | ORAL | Status: DC
  Administered 2014-03-12 – 2014-03-14 (×3): 40 mg via ORAL
  Filled 2014-03-12 (×3): qty 1

## 2014-03-12 MED ORDER — CEFAZOLIN SODIUM-DEXTROSE 2-3 GM-% IV SOLR
2.0000 g | INTRAVENOUS | Status: AC
  Administered 2014-03-12: 2 g via INTRAVENOUS
  Administered 2014-03-12: 1 g via INTRAVENOUS

## 2014-03-12 MED ORDER — 0.9 % SODIUM CHLORIDE (POUR BTL) OPTIME
TOPICAL | Status: DC | PRN
  Administered 2014-03-12: 2000 mL

## 2014-03-12 MED ORDER — ONDANSETRON HCL 4 MG/2ML IJ SOLN
INTRAMUSCULAR | Status: AC
  Filled 2014-03-12: qty 2

## 2014-03-12 MED ORDER — LIDOCAINE HCL 1 % IJ SOLN
INTRAMUSCULAR | Status: DC | PRN
  Administered 2014-03-12: 40 mg via INTRADERMAL

## 2014-03-12 MED ORDER — ONDANSETRON HCL 4 MG PO TABS: 4.0000 mg | ORAL_TABLET | Freq: Four times a day (QID) | ORAL | Status: DC | PRN

## 2014-03-12 MED ORDER — DOCUSATE SODIUM 100 MG PO CAPS
100.0000 mg | ORAL_CAPSULE | Freq: Two times a day (BID) | ORAL | Status: DC
  Administered 2014-03-12 – 2014-03-14 (×4): 100 mg via ORAL
  Filled 2014-03-12 (×4): qty 1

## 2014-03-12 MED ORDER — ONDANSETRON HCL 4 MG/2ML IJ SOLN
4.0000 mg | Freq: Once | INTRAMUSCULAR | Status: AC
  Administered 2014-03-12: 4 mg via INTRAVENOUS

## 2014-03-12 MED ORDER — SODIUM CHLORIDE 0.9 % IV SOLN
INTRAVENOUS | Status: DC
  Administered 2014-03-12 – 2014-03-13 (×2): via INTRAVENOUS

## 2014-03-12 MED ORDER — DIPHENHYDRAMINE HCL 12.5 MG/5ML PO ELIX: 12.5000 mg | ORAL_SOLUTION | Freq: Four times a day (QID) | ORAL | Status: DC | PRN

## 2014-03-12 MED ORDER — CEFAZOLIN SODIUM 1-5 GM-% IV SOLN: 1.0000 g | INTRAVENOUS | Status: DC

## 2014-03-12 MED ORDER — DIPHENHYDRAMINE HCL 50 MG/ML IJ SOLN: 12.5000 mg | Freq: Four times a day (QID) | INTRAMUSCULAR | Status: DC | PRN

## 2014-03-12 MED ORDER — KETOROLAC TROMETHAMINE 30 MG/ML IJ SOLN
INTRAMUSCULAR | Status: AC
  Filled 2014-03-12: qty 1

## 2014-03-12 MED ORDER — SODIUM CHLORIDE 0.9 % IJ SOLN: 9.0000 mL | INTRAMUSCULAR | Status: DC | PRN

## 2014-03-12 MED ORDER — GLYCOPYRROLATE 0.2 MG/ML IJ SOLN
INTRAMUSCULAR | Status: AC
  Filled 2014-03-12: qty 1

## 2014-03-12 SURGICAL SUPPLY — 50 items
APL SKNCLS STERI-STRIP NONHPOA (GAUZE/BANDAGES/DRESSINGS)
BAG HAMPER (MISCELLANEOUS) ×3 IMPLANT
BENZOIN TINCTURE PRP APPL 2/3 (GAUZE/BANDAGES/DRESSINGS) IMPLANT
CLOTH BEACON ORANGE TIMEOUT ST (SAFETY) ×3 IMPLANT
COVER LIGHT HANDLE STERIS (MISCELLANEOUS) ×6 IMPLANT
DRAPE WARM FLUID 44X44 (DRAPE) ×3 IMPLANT
DRESSING TELFA 8X3 (GAUZE/BANDAGES/DRESSINGS) ×3 IMPLANT
DURAPREP 26ML APPLICATOR (WOUND CARE) ×3 IMPLANT
ELECT REM PT RETURN 9FT ADLT (ELECTROSURGICAL) ×3
ELECTRODE REM PT RTRN 9FT ADLT (ELECTROSURGICAL) ×2 IMPLANT
EVACUATOR DRAINAGE 10X20 100CC (DRAIN) IMPLANT
EVACUATOR SILICONE 100CC (DRAIN) ×3
GLOVE BIOGEL PI IND STRL 7.0 (GLOVE) IMPLANT
GLOVE BIOGEL PI IND STRL 8 (GLOVE) IMPLANT
GLOVE BIOGEL PI INDICATOR 7.0 (GLOVE) ×4
GLOVE BIOGEL PI INDICATOR 8 (GLOVE) ×1
GLOVE ECLIPSE 7.0 STRL STRAW (GLOVE) ×2 IMPLANT
GLOVE ECLIPSE 8.0 STRL XLNG CF (GLOVE) ×1 IMPLANT
GLOVE ECLIPSE 9.0 STRL (GLOVE) ×3 IMPLANT
GLOVE INDICATOR STER SZ 9 (GLOVE) ×3 IMPLANT
GOWN SPEC L3 XXLG W/TWL (GOWN DISPOSABLE) ×5 IMPLANT
GOWN STRL REUS W/TWL LRG LVL3 (GOWN DISPOSABLE) ×7 IMPLANT
INST SET MAJOR GENERAL (KITS) ×3 IMPLANT
KIT ROOM TURNOVER APOR (KITS) ×3 IMPLANT
MANIFOLD NEPTUNE II (INSTRUMENTS) ×3 IMPLANT
NS IRRIG 1000ML POUR BTL (IV SOLUTION) ×6 IMPLANT
PACK ABDOMINAL MAJOR (CUSTOM PROCEDURE TRAY) ×3 IMPLANT
PAD ABD 5X9 TENDERSORB (GAUZE/BANDAGES/DRESSINGS) ×2 IMPLANT
RETRACTOR WND ALEXIS 25 LRG (MISCELLANEOUS) IMPLANT
RETRACTOR WOUND ALXS 34CM XLRG (MISCELLANEOUS) IMPLANT
RTRCTR WOUND ALEXIS 25CM LRG (MISCELLANEOUS)
RTRCTR WOUND ALEXIS 34CM XLRG (MISCELLANEOUS) ×3
SET BASIN LINEN APH (SET/KITS/TRAYS/PACK) ×3 IMPLANT
SPONGE DRAIN TRACH 4X4 STRL 2S (GAUZE/BANDAGES/DRESSINGS) ×1 IMPLANT
SPONGE GAUZE 4X4 12PLY (GAUZE/BANDAGES/DRESSINGS) ×3 IMPLANT
SPONGE LAP 18X18 X RAY DECT (DISPOSABLE) ×1 IMPLANT
SUT CHROMIC 0 CT 1 (SUTURE) ×17 IMPLANT
SUT CHROMIC 2 0 CT 1 (SUTURE) ×3 IMPLANT
SUT CHROMIC GUT BROWN 0 54 (SUTURE) IMPLANT
SUT CHROMIC GUT BROWN 0 54IN (SUTURE)
SUT ETHILON 3 0 FSL (SUTURE) IMPLANT
SUT PDS AB CT VIOLET #0 27IN (SUTURE) IMPLANT
SUT PLAIN CT 1/2CIR 2-0 27IN (SUTURE) ×4 IMPLANT
SUT PROLENE 0 CT 1 30 (SUTURE) IMPLANT
SUT VIC AB 0 CT1 27 (SUTURE) ×9
SUT VIC AB 0 CT1 27XBRD ANTBC (SUTURE) IMPLANT
SUT VIC AB 0 CT1 27XCR 8 STRN (SUTURE) IMPLANT
TAPE CLOTH SURG 4X10 WHT LF (GAUZE/BANDAGES/DRESSINGS) ×1 IMPLANT
TOWEL BLUE STERILE X RAY DET (MISCELLANEOUS) ×4 IMPLANT
TRAY FOLEY CATH 16FR SILVER (SET/KITS/TRAYS/PACK) ×3 IMPLANT

## 2014-03-12 NOTE — Interval H&P Note (Signed)
History and Physical Interval Note:  03/12/2014 11:53 AM  Renee Vargas  has presented today for surgery, with the diagnosis of FIBROIDS , and anemia, due to menorrhagia. The various methods of treatment have been discussed with the patient and family. After consideration of risks, benefits and other options for treatment, the patient has consented to  Procedure(s): HYSTERECTOMY ABDOMINAL (N/A) BILATERAL SALPINGECTOMY (Bilateral) as a surgical intervention .  The patient's history has been reviewed, patient examined, no change in status, stable for surgery.  I have reviewed the patient's chart and labs.  Questions were answered to the patient's satisfaction.   She has 2 units PRBC available.  Jonnie Kind

## 2014-03-12 NOTE — Anesthesia Preprocedure Evaluation (Addendum)
Anesthesia Evaluation  Patient identified by MRN, date of birth, ID band Patient awake    Reviewed: Allergy & Precautions, H&P , NPO status , Patient's Chart, lab work & pertinent test results  Airway Mallampati: III TM Distance: >3 FB Neck ROM: Full  Mouth opening: Limited Mouth Opening  Dental  (+) Teeth Intact   Pulmonary neg pulmonary ROS,  breath sounds clear to auscultation        Cardiovascular hypertension, Pt. on medications Rhythm:Regular Rate:Normal     Neuro/Psych PSYCHIATRIC DISORDERS Anxiety    GI/Hepatic negative GI ROS,   Endo/Other    Renal/GU      Musculoskeletal   Abdominal   Peds  Hematology  (+) anemia ,   Anesthesia Other Findings   Reproductive/Obstetrics                          Anesthesia Physical Anesthesia Plan  ASA: II  Anesthesia Plan: General   Post-op Pain Management:    Induction: Intravenous  Airway Management Planned: Oral ETT  Additional Equipment:   Intra-op Plan:   Post-operative Plan: Extubation in OR  Informed Consent: I have reviewed the patients History and Physical, chart, labs and discussed the procedure including the risks, benefits and alternatives for the proposed anesthesia with the patient or authorized representative who has indicated his/her understanding and acceptance.     Plan Discussed with:   Anesthesia Plan Comments:        Anesthesia Quick Evaluation

## 2014-03-12 NOTE — H&P (View-Only) (Signed)
This chart was scribed by [name], Medical Scribe, for Dr. Mallory Shirk on 03/05/14 at 1:55 PM. This chart was reviewed by Dr. Mallory Shirk and is accurate.   [place] [facility] Visit   Patient name: Renee Vargas. [place] MRN [id number] Date of birth: 06/12/1961   CC & HPI:   Renee Balin. Vargas is a 53 y.o. female presenting today for a hgb check and f/u for vaginal bleeding. Pt was started on estradiol 2 mg BID during her last visit and her megace Rx was stopped. She reports that she has had continued bleeding thru the weekend. Hgb at 6.7 today.   ROS:   + menorrhagia  + fatigue  No other complaints   Pertinent History Reviewed:   Medical & Surgical Hx: Reviewed: Significant for anemia on Fe, uterine fibroids and menorrhagia  Medications: Reviewed & Updated - see associated section  Social History: Reviewed - reports that she has never smoked. She has never used smokeless tobacco.   Objective Findings:   Vitals: BP 118/76  LMP 02/21/2014  Physical Examination: Not done  Physical Examination: General appearance - alert, well appearing, and in no distress, oriented to person, place, and time, overweight, acyanotic, in no respiratory distress and anxious  Mental status - alert, oriented to person, place, and time, depressed mood  Eyes - pupils equal and reactive, extraocular eye movements intact  Chest - clear to auscultation, no wheezes, rales or rhonchi, symmetric air entry  Heart - normal rate and regular rhythm  Abdomen - soft, nontender, nondistended, no masses or organomegaly  Fibroid uterus 14-16 wk size on recent exam  Pelvic - normal external genitalia, vulva, vagina, cervix, uterus and adnexa, VULVA: normal appearing vulva with no masses, tenderness or lesions, VAGINA: normal appearing vagina with normal color and discharge, no lesions, moderate vag bleeding  Extremities - peripheral pulses normal, no pedal edema, no clubbing or cyanosis, no pedal edema noted   Assessment &  Plan:   A:  1. Anemia  2. Menorrhagia  3. Uterine fibroids  4. S/p Lupron injection for fibroid supression  P:  1.Admission for blood transfusion x3-4 and IV premarin  2. Surgery planned for next week Wednesday.

## 2014-03-12 NOTE — Brief Op Note (Signed)
03/12/2014  2:55 PM  PATIENT:  Renee Vargas  53 y.o. female  PRE-OPERATIVE DIAGNOSIS:  Menorrhagia, Uterine Fibroids, Anemia  POST-OPERATIVE DIAGNOSIS:  Menorrhagia, Uterine Fibroids, Anemia, Right Ovarian Cyst  PROCEDURE:  Procedure(s): SUPRACERVICAL HYSTERECTOMY (N/A) BILATERAL SALPINGO OOPHORECTOMY  SURGEON:  Surgeon(s) and Role:    * Jonnie Kind, MD - Primary    * Florian Buff, MD - Assisting  PHYSICIAN ASSISTANT:   ASSISTANTS: Henderson CST   ANESTHESIA:   general  EBL:  Total I/O In: 2400 [I.V.:2400] Out: 650 [Urine:300; Blood:350]  BLOOD ADMINISTERED:none  DRAINS: (Subcutaneous space) Jackson-Pratt drain(s) with closed bulb suction in the Subcutaneous space , 1  LOCAL MEDICATIONS USED:  NONE  SPECIMEN:  Source of Specimen:  Uterus, without cervix, both tubes and ovaries  DISPOSITION OF SPECIMEN:  PATHOLOGY  COUNTS:  YES  TOURNIQUET:  * No tourniquets in log *  DICTATION: .Dragon Dictation  PLAN OF CARE: Admit to inpatient   PATIENT DISPOSITION:  PACU - hemodynamically stable.   Delay start of Pharmacological VTE agent (>24hrs) due to surgical blood loss or risk of bleeding: not applicable

## 2014-03-12 NOTE — Op Note (Signed)
2:55 PM  PATIENT:  Renee Vargas  53 y.o. female  PRE-OPERATIVE DIAGNOSIS:  Menorrhagia, Uterine Fibroids, Anemia  POST-OPERATIVE DIAGNOSIS:  Menorrhagia, Uterine Fibroids, Anemia, Right Ovarian Cyst  PROCEDURE:  Procedure(s): SUPRACERVICAL HYSTERECTOMY (N/A) BILATERAL SALPINGO OOPHORECTOMY  SURGEON:  Surgeon(s) and Role:    * Jonnie Kind, MD - Primary    * Florian Buff, MD - Assisting  PHYSICIAN ASSISTANT:   ASSISTANTS: Henderson CST   ANESTHESIA:   general  EBL:  Total I/O In: 2400 [I.V.:2400] Out: 867 [Urine:300; Blood:350]  Details of procedure. Patient was taken operating room prepped and draped for midline abdominal and procedure, timeout conducted, Ancef administered 3 g, and midline incision performed from the umbilicus inferiorly x6 inches. Sharp dissection combined blunt dissection to the fascia which was opened in the midline, large Alexis wound retractor positioned and bowel packed away. Large round fibroid uterus was identified. There was a large cyst on the left ovary. This was ovary was taken off initially, and the pedicle ligated round ligaments were then taken down on its and doubly ligating and transecting between them and bladder flap was developed anteriorly by sharp dissection. The left IP ligament was isolated doubly clamped transected and suture ligated. The vessels to the uterius were skeletonized on each side. Curved Heaney clamp was used to cross-clamp the uterine vessels on the left with backbleeding controlled with straight Heaney clamp, transected uterine vessels and double ligature on the left. The right side was treated similarly. Straight Heaney clamp was then used to cross-clamp across the upper cardinal ligament tissues, with knife dissection and 0 Vicryl ligature. At this time the uterine body was amputated off the lower uterine segment, approximately 2 cm above the insertion of the uterosacral ligaments into the back of the cervix. Additional  figure-of-eight sutures were placed at each side of the lower uterine segment to complete hemostasis, then the lower uterine segment stump was cored out and a reverse conization technique used to eliminate any endocervical tissue that remained. The endocervical stump was then oversewn with a series of figure-of-eight sutures of 0 Vicryl pelvis was inspected and confirmed as hemostatic, irrigated. Pedicles were hemostatic, and laparotomy equipment removed followed by double-stranded PDS been used to close the fascia in a continuous running fashion, followed by irrigation of the subcutaneous fat reapproximation with interrupted 20 plain, then staple closure the skin. A 10 mm Jackson-Pratt drain was placed in the depths of the subcutaneous fatty space and allowed to exit through stab incision in the left lower quadrant. This was placed to bulb suction. Sponge and needle counts were correct estimated blood loss 350 cc condition to recovery in good

## 2014-03-12 NOTE — Anesthesia Procedure Notes (Signed)
Procedure Name: Intubation Date/Time: 03/12/2014 12:34 PM Performed by: Tressie Stalker E Pre-anesthesia Checklist: Patient identified, Patient being monitored, Timeout performed, Emergency Drugs available and Suction available Patient Re-evaluated:Patient Re-evaluated prior to inductionOxygen Delivery Method: Circle System Utilized Preoxygenation: Pre-oxygenation with 100% oxygen Intubation Type: IV induction Ventilation: Mask ventilation without difficulty Laryngoscope Size: Mac and 3 Grade View: Grade I Tube type: Oral Tube size: 7.0 mm Number of attempts: 1 Airway Equipment and Method: stylet Placement Confirmation: ETT inserted through vocal cords under direct vision,  positive ETCO2 and breath sounds checked- equal and bilateral Secured at: 21 cm Tube secured with: Tape Dental Injury: Teeth and Oropharynx as per pre-operative assessment

## 2014-03-12 NOTE — Anesthesia Postprocedure Evaluation (Signed)
  Anesthesia Post-op Note  Patient: Renee Vargas  Procedure(s) Performed: Procedure(s): SUPRACERVICAL HYSTERECTOMY (N/A) BILATERAL SALPINGO OOPHORECTOMY  Patient Location: PACU  Anesthesia Type:General  Level of Consciousness: awake, alert  and oriented  Airway and Oxygen Therapy: Patient Spontanous Breathing and Patient connected to face mask oxygen  Post-op Pain: none  Post-op Assessment: Post-op Vital signs reviewed, Patient's Cardiovascular Status Stable, Respiratory Function Stable, Patent Airway and No signs of Nausea or vomiting  Post-op Vital Signs: Reviewed and stable  Complications: No apparent anesthesia complications

## 2014-03-12 NOTE — Transfer of Care (Signed)
Immediate Anesthesia Transfer of Care Note  Patient: Renee Vargas  Procedure(s) Performed: Procedure(s): SUPRACERVICAL HYSTERECTOMY (N/A) BILATERAL SALPINGO OOPHORECTOMY  Patient Location: PACU  Anesthesia Type:General  Level of Consciousness: awake  Airway & Oxygen Therapy: Patient Spontanous Breathing and Patient connected to face mask oxygen  Post-op Assessment: Report given to PACU RN  Post vital signs: Reviewed  Complications: No apparent anesthesia complications

## 2014-03-12 NOTE — Progress Notes (Signed)
Renee Vargas is a 53 y.o. female patient who transferred  from post-op awake, alert  & orientated  X 3, Full Code, VSS - BP: 145/85, Temp: 97.8, pulse: 64, Respirations: 18 on RA. No c/o shortness of breath, no c/o chest pain, no distress noted.   IV site WDL: Left hand with a transparent dsg that's clean dry and intact.  Allergies:   Allergies  Allergen Reactions  . Aleve [Naproxen Sodium] Hives     Past Medical History  Diagnosis Date  . Hypertension   . Anemia   . Anxiety   . History of gout     Pt orientation to unit, room and routine. SR up x 2, fall risk assessment complete with Patient and family verbalizing understanding of risks associated with falls. Pt verbalizes an understanding of how to use the call bell and to call for help before getting out of bed.  Skin, clean-dry- Surgical incision midline with dressing clean, dry and intact.   No evidence of skin break down noted on exam.    Will cont to monitor and assist as needed.  Regino Bellow, RN 03/12/2014 [phone]

## 2014-03-13 ENCOUNTER — Encounter (HOSPITAL_COMMUNITY): Payer: Self-pay | Admitting: Obstetrics and Gynecology

## 2014-03-13 LAB — CBC
HCT: 28.5 % — ABNORMAL LOW (ref 36.0–46.0)
Hemoglobin: 8.9 g/dL — ABNORMAL LOW (ref 12.0–15.0)
MCH: 23.3 pg — ABNORMAL LOW (ref 26.0–34.0)
MCHC: 31.2 g/dL (ref 30.0–36.0)
MCV: 74.6 fL — ABNORMAL LOW (ref 78.0–100.0)
Platelets: 422 10*3/uL — ABNORMAL HIGH (ref 150–400)
RBC: 3.82 MIL/uL — ABNORMAL LOW (ref 3.87–5.11)
RDW: 21.8 % — ABNORMAL HIGH (ref 11.5–15.5)
WBC: 11.5 10*3/uL — ABNORMAL HIGH (ref 4.0–10.5)

## 2014-03-13 MED ORDER — HYDROMORPHONE HCL PF 1 MG/ML IJ SOLN: 1.0000 mg | INTRAMUSCULAR | Status: DC | PRN

## 2014-03-13 MED ORDER — SODIUM CHLORIDE 0.9 % IJ SOLN: 3.0000 mL | INTRAMUSCULAR | Status: DC | PRN

## 2014-03-13 MED ORDER — SODIUM CHLORIDE 0.9 % IJ SOLN
3.0000 mL | Freq: Two times a day (BID) | INTRAMUSCULAR | Status: DC
  Administered 2014-03-13 (×2): 3 mL via INTRAVENOUS

## 2014-03-13 MED ORDER — SODIUM CHLORIDE 0.9 % IV SOLN
250.0000 mL | INTRAVENOUS | Status: DC | PRN
  Administered 2014-03-13: 250 mL via INTRAVENOUS

## 2014-03-13 NOTE — Progress Notes (Signed)
1 Day Post-Op Procedure(s) (LRB): SUPRACERVICAL HYSTERECTOMY (N/A) BILATERAL SALPINGO OOPHORECTOMY (Bilateral)  Subjective: Patient reports incisional pain and tolerating PO.    Objective: I have reviewed patient's vital signs, intake and output and labs.  General: alert, cooperative, no distress and morbidly obese Resp: clear to auscultation bilaterally GI: soft, non-tender; bowel sounds normal; no masses,  no organomegaly and incision: clean, dry and intact Vaginal Bleeding: none  Assessment: s/p Procedure(s): SUPRACERVICAL HYSTERECTOMY (N/A) BILATERAL SALPINGO OOPHORECTOMY (Bilateral): stable  Plan: Advance diet Advance to PO medication Discontinue IV fluids discontinue foley  LOS: 1 day    Kechia Yahnke V 03/13/2014, 8:12 AM

## 2014-03-13 NOTE — Progress Notes (Signed)
Pt appears to be resting comfortable, is arousable and answers clearly when speaking even though on Dilaudid PCA, VSS, pain is mostly controled by the PCA, low bed, hob self regulated, call bell at pt's bedside and pt has family members staying all night at her bed side, will cont to monitor

## 2014-03-13 NOTE — Progress Notes (Signed)
Pt appears to be resting as comfortably as can be expected right now, vss, on pCA dilaudid, low be,  hob self regulated, call bell and family at pt's side, will cont to monitor

## 2014-03-13 NOTE — Addendum Note (Signed)
Addendum created 03/13/14 1027 by Ollen Bowl, CRNA   Modules edited: Notes Section   Notes Section:  File: [id number]

## 2014-03-13 NOTE — Anesthesia Postprocedure Evaluation (Signed)
  Anesthesia Post-op Note  Patient: Renee Vargas  Procedure(s) Performed: Procedure(s): SUPRACERVICAL HYSTERECTOMY (N/A) BILATERAL SALPINGO OOPHORECTOMY (Bilateral)  Patient Location: Nursing Unit  Anesthesia Type:General  Level of Consciousness: awake, alert  and oriented  Airway and Oxygen Therapy: Patient Spontanous Breathing  Post-op Pain: mild  Post-op Assessment: Post-op Vital signs reviewed, Patient's Cardiovascular Status Stable, Respiratory Function Stable, Patent Airway and No signs of Nausea or vomiting  Post-op Vital Signs: Reviewed and stable  Complications: No apparent anesthesia complications

## 2014-03-14 ENCOUNTER — Ambulatory Visit: Payer: Self-pay | Admitting: Obstetrics and Gynecology

## 2014-03-14 LAB — TYPE AND SCREEN
ABO/RH(D): O POS
Antibody Screen: NEGATIVE
Unit division: 0
Unit division: 0

## 2014-03-14 MED ORDER — DSS 100 MG PO CAPS: 100.0000 mg | ORAL_CAPSULE | Freq: Two times a day (BID) | ORAL | Status: DC

## 2014-03-14 MED ORDER — ESTRADIOL 2 MG PO TABS: 2.0000 mg | ORAL_TABLET | Freq: Every day | ORAL | Status: DC

## 2014-03-14 MED ORDER — PHENTERMINE HCL 30 MG PO CAPS: 30.0000 mg | ORAL_CAPSULE | ORAL | Status: DC

## 2014-03-14 MED ORDER — OXYCODONE-ACETAMINOPHEN 5-325 MG PO TABS: 2.0000 | ORAL_TABLET | ORAL | Status: DC | PRN

## 2014-03-14 NOTE — Progress Notes (Signed)
Patient discharged home with family.  IV removed - WNL.  Instructed on prescriptions and how to take.  Instructed on JP drain care and incisional care.  Educated on s/s of infection, and if having any to call MD prior to follow up.  Follow up appointment in place.  Patient verbalizes understanding of DC instructions.  No questions at this time.  Stable to DC.  Left floor via WC with RN assist.

## 2014-03-14 NOTE — Discharge Summary (Signed)
Physician Discharge Summary  Patient ID: KALAYAH LESKE MRN: [id number] DOB/AGE: [birth year] 53 y.o.  Admit date: 03/12/2014 Discharge date: 03/14/2014  Admission Diagnoses: 1: Menorrhagia 2: uterine fibroids 3. Anemia 4. Morbid obesity 5. CHTN Discharge Diagnoses: same  Active Problems:   Status post abdominal supracervical subtotal hysterectomy   Discharged Condition: good  Hospital Course: Beverely A. Aronoff is a 53 y.o. female presenting today for worsening of vaginal bleeding after Lupron injection last week. Friday she was wearing a panty liner. Saturday she had to wear a regular pad. Monday she had to wear overnight pads. Tuesday she passed a large clot and was changing pads every 15 minutes. Started on megace 40 mg Monday. Currently, the bleeding is still heavy but improved from Tuesday. +fatigue, no energy. Hgb has also dropped since last visit from 8.9 to 8.6, to 6/7 Was on estradiol but not currently.  Hgb improved to 10.8   Consults: None  Significant Diagnostic Studies: labs: Hgb 10.8 preop and hgb 8.9 postop  Treatments: surgery: Abdomina Supracervical hysterectomy  Discharge Exam: Blood pressure 132/79, pulse 76, temperature 98.7 F (37.1 C), temperature source Oral, resp. rate 16, height 5\' 6"  (1.676 m), weight 326 lb 15.9 oz (148.324 kg), last menstrual period 12/04/2013, SpO2 98.00%. General appearance: alert, cooperative and no distress Head: Normocephalic, without obvious abnormality, atraumatic Cardio: regular rate and rhythm GI: soft, non-tender; bowel sounds normal; no masses,  no organomegaly Incision/Wound:midline vertical, with Terrial Rhodes Drain in place  Disposition: 01-Home or Self Care  Discharge Orders   Future Appointments Provider Department Dept Phone   03/20/2014 11:30 AM Jonnie Kind, MD [facility] [phone]   Future Orders Complete By Expires   Call MD for:  temperature >100.4  As directed    Diet - low sodium heart healthy   As directed    Discharge instructions  As directed    Comments:     General Gynecological Post-Operative Instructions You may expect to feel dizzy, weak, and drowsy for as long as 24 hours after receiving the medicine that made you sleep (anesthetic). The following information pertains to your recovery period for the first 24 hours following surgery.  Do not drive a car, ride a bicycle, participate in physical activities, or take public transportation until you are done taking narcotic pain medicines or as directed by your caregiver.  Do not drink alcohol or take tranquilizers.  Do not take medicine that has not been prescribed by your caregiver.  Do not sign important papers or make important decisions while on narcotic pain medicines.  Have a responsible person with you.  CARE OF INCISION  Keep incision clean and dry. Take showers instead of baths until your caregiver gives you permission to take baths. Check with your caregiver if you have tubes coming from the wound site.  Avoid heavy lifting (more than 10 pounds/4.5 kilograms), pushing, or pulling.  Avoid activities that may risk injury to your surgical site.  Only take over-the-counter or prescription medicines for pain, discomfort, or fever as directed by your caregiver. Do not take aspirin. It can make you bleed. Take medicines (antibiotics) that kill germs as directed.  Call the office or go to the MAU if:  You feel sick to your stomach (nauseous).  You start to throw up (vomit).  You have trouble eating or drinking.  You have an oral temperature above 100.4.  You have constipation that is not helped by adjusting diet or increasing fluid intake. Pain medicines are a  common cause of constipation.  SEEK IMMEDIATE MEDICAL CARE IF:  You have persistent dizziness.  You have difficulty breathing or a congested sounding (croupy) cough.  You have an oral temperature above 102.5, not controlled by medicine.  There is increasing pain or  tenderness near or in the surgical site.  ExitCare Patient Information 2011 [place].   Increase activity slowly  As directed    Remove dressing in 48 hours  As directed    Sexual Activity Restrictions  As directed    Comments:     8 wk       Medication List         amLODipine 5 MG tablet  Commonly known as:  NORVASC  Take 5 mg by mouth daily.     citalopram 40 MG tablet  Commonly known as:  CELEXA  Take 40 mg by mouth daily.     DSS 100 MG Caps  Take 100 mg by mouth 2 (two) times daily.     estradiol 2 MG tablet  Commonly known as:  ESTRACE  Take 1 tablet (2 mg total) by mouth daily.     IRON PO  Take 1 tablet by mouth daily.     oxyCODONE-acetaminophen 5-325 MG per tablet  Commonly known as:  PERCOCET/ROXICET  Take 2 tablets by mouth every 4 (four) hours as needed for severe pain (moderate to severe pain (when tolerating fluids)).     phentermine 30 MG capsule  Take 1 capsule (30 mg total) by mouth every morning.     triamterene-hydrochlorothiazide 37.5-25 MG per tablet  Commonly known as:  MAXZIDE-25  Take 1 tablet by mouth daily.       Followup one week for drain removal and incision check  Signed: Alaena Strader V 03/14/2014, 7:40 AM

## 2014-03-20 ENCOUNTER — Encounter: Payer: Self-pay | Admitting: Obstetrics and Gynecology

## 2014-03-20 ENCOUNTER — Ambulatory Visit (INDEPENDENT_AMBULATORY_CARE_PROVIDER_SITE_OTHER): Payer: Self-pay | Admitting: Obstetrics and Gynecology

## 2014-03-20 VITALS — BP 168/80 | Ht 66.0 in | Wt 326.0 lb

## 2014-03-20 DIAGNOSIS — Z9889 Other specified postprocedural states: Secondary | ICD-10-CM

## 2014-03-20 NOTE — Progress Notes (Signed)
This chart was scribed by [name], Medical Scribe, for Dr. Mallory Shirk on 03/20/14 at 12:24 PM. This chart was reviewed by Dr. Mallory Shirk and is accurate.  Subjective:  Renee Vargas is a 54 y.o. female who presents to the clinic 8 days status post supracervical hysterectomy and bilateral salping oophorectomy. Drain tube in place, to be removed .   Review of Systems Negative.  She has been eating a regular diet without difficulty.   Bowel movements are normal. Pain is controlled with current analgesics. Medications being used: narcotic analgesics including oxycodone/acetaminophen (Percocet, Tylox).  Objective:  BP 168/80  Ht 5\' 6"  (1.676 m)  Wt 326 lb (147.873 kg)  BMI 52.64 kg/m2  LMP 02/21/2014 General:Well developed, well nourished.  No acute distress. Abdomen: Bowel sounds normal, soft, non-tender. Pelvic Exam: Deferred  Incision(s):   Healing well, no drainage, no erythema, no hernia, no swelling, no dehiscence, incision well approximated. Drain tube removed   Assessment:  Post-Op 8 days s/p supracervical hysterectomy and bilateral salping oophorectomy.  Doing well postoperatively.   Plan:  1.Wound care discussed.  2. Continue any current medications. 3. Activity restrictions: none 4. return to work: not applicable. 5. Follow up in 6 days for staple  removal.

## 2014-03-26 ENCOUNTER — Encounter: Payer: Self-pay | Admitting: Obstetrics and Gynecology

## 2014-03-26 ENCOUNTER — Ambulatory Visit (INDEPENDENT_AMBULATORY_CARE_PROVIDER_SITE_OTHER): Payer: Self-pay | Admitting: Obstetrics and Gynecology

## 2014-03-26 VITALS — BP 122/80 | Ht 66.0 in | Wt 319.0 lb

## 2014-03-26 DIAGNOSIS — Z9889 Other specified postprocedural states: Secondary | ICD-10-CM | POA: Insufficient documentation

## 2014-03-26 NOTE — Patient Instructions (Signed)
You may begin driving in one week and return to work in one week. If you need a note, please let us know.

## 2014-03-26 NOTE — Progress Notes (Signed)
This chart was scribed by [name], Medical Scribe, for Dr. Mallory Shirk on 03/26/14 at 11:48 AM. This chart was reviewed by Dr. Mallory Shirk and is accurate.  Subjective:  Renee Vargas is a 53 y.o. female who presents to the clinic 2 weeks status post  supracervical hysterectomy and bilateral salping oophorectomy  Review of Systems Negative except mild skin irritation from the staples, reports a 7 lb weight loss since last visit  She has been eating a regular diet without difficulty.   Bowel movements are normal. Pain is controlled with current analgesics. Medications being used: narcotic analgesics including Percocet.  Objective:  BP 122/80  Ht 5\' 6"  (1.676 m)  Wt 319 lb (144.697 kg)  BMI 51.51 kg/m2  LMP 02/21/2014 General:Well developed, well nourished.  No acute distress. Abdomen: Bowel sounds normal, soft, non-tender. Pelvic Exam: Deferred   Incision(s):   Healing well, no drainage, no erythema, no hernia, no swelling, no dehiscence, incision well approximated. Staples removed without complications  Assessment:  Post-Op 2 weeks s/p supracervical hysterectomy and bilateral salping oophorectomy  Doing well postoperatively.   Plan:  1.Wound care discussed.  Abdominal binder encouraged. 2. .Continue any current medications. 3. Activity restrictions: no driving over the next week  4. return to work: 1-2 weeks. 5. Follow up in 4 weeks.

## 2014-04-04 ENCOUNTER — Encounter: Payer: Self-pay | Admitting: Obstetrics and Gynecology

## 2014-04-17 ENCOUNTER — Other Ambulatory Visit (HOSPITAL_COMMUNITY): Payer: Self-pay

## 2014-04-23 ENCOUNTER — Encounter: Payer: Self-pay | Admitting: Obstetrics and Gynecology

## 2014-04-23 ENCOUNTER — Ambulatory Visit (INDEPENDENT_AMBULATORY_CARE_PROVIDER_SITE_OTHER): Payer: Self-pay | Admitting: Obstetrics and Gynecology

## 2014-04-23 VITALS — BP 120/80 | Wt 314.0 lb

## 2014-04-23 DIAGNOSIS — Z9889 Other specified postprocedural states: Secondary | ICD-10-CM

## 2014-04-23 DIAGNOSIS — Z90711 Acquired absence of uterus with remaining cervical stump: Secondary | ICD-10-CM

## 2014-04-23 NOTE — Progress Notes (Signed)
This chart was scribed by [name], Medical Scribe, for Dr. Mallory Shirk on 04/23/14 at 12:19 PM. This chart was reviewed by Dr. Mallory Shirk for accuracy.  Subjective:  Renee Vargas is a 53 y.o. female who presents to the clinic 6 weeks status post supracervical hysterectomy and bilateral salping oophorectomy  Review of Systems Negative except 5 lb weight loss since last visit, has been sexually active since surgery without pain  She has been eating a regular diet without difficulty.   Bowel movements are normal. The patient is not having any pain.  Objective:  BP 120/80  Wt 314 lb (142.429 kg)  LMP 02/21/2014 Chaperone present for exam which was performed with pt's permission General:Well developed, well nourished.  No acute distress. Abdomen: Bowel sounds normal, soft, non-tender. Pelvic Exam:    External Genitalia:  Normal.    Vagina: Normal    Bimanual: Normal    Cervix: well healed surgical cuff  Incision(s):   Healing well, no drainage, no erythema, no hernia, no swelling, no dehiscence, incision well approximated.   Assessment:  Post-Op 6 weeks s/p supracervical hysterectomy and bilateral salping oophorectomy  Doing well postoperatively.   Plan:  1. Wound care discussed   2. Continue any current medications-continue phenetamine but will not refill without weight reassessment  3. Activity restrictions: none 4. return to work: not applicable. 5. Follow up PRN

## 2014-04-30 ENCOUNTER — Encounter: Payer: Self-pay | Admitting: Obstetrics and Gynecology

## 2014-05-05 ENCOUNTER — Telehealth: Payer: Self-pay | Admitting: *Deleted

## 2014-05-05 NOTE — Telephone Encounter (Signed)
Renee Vargas from patient assistance is calling to get [facility] prescription for Lupron refilled.  Their phone # is [phone] option 8, pt ID [id number].

## 2014-05-06 NOTE — Telephone Encounter (Signed)
Pt had hysterectomy and no longer needs the Lupron per Dr Glo Herring.  Georgia Dom, RN called and notified patient assistance.

## 2014-06-25 ENCOUNTER — Other Ambulatory Visit (HOSPITAL_COMMUNITY): Payer: Self-pay | Admitting: Physician Assistant

## 2014-10-03 ENCOUNTER — Other Ambulatory Visit: Payer: Self-pay

## 2015-08-17 ENCOUNTER — Other Ambulatory Visit (HOSPITAL_COMMUNITY): Payer: Self-pay | Admitting: Family Medicine

## 2015-08-17 DIAGNOSIS — Z1231 Encounter for screening mammogram for malignant neoplasm of breast: Secondary | ICD-10-CM

## 2015-08-21 ENCOUNTER — Ambulatory Visit (HOSPITAL_COMMUNITY): Payer: Self-pay

## 2015-08-26 ENCOUNTER — Ambulatory Visit (HOSPITAL_COMMUNITY)
Admission: RE | Admit: 2015-08-26 | Discharge: 2015-08-26 | Disposition: A | Discharge status: Home / Self Care | Payer: BLUE CROSS/BLUE SHIELD | Source: Ambulatory Visit | Attending: Family Medicine | Admitting: Family Medicine

## 2015-08-26 DIAGNOSIS — Z1231 Encounter for screening mammogram for malignant neoplasm of breast: Secondary | ICD-10-CM | POA: Diagnosis not present

## 2015-08-31 ENCOUNTER — Ambulatory Visit (HOSPITAL_COMMUNITY): Payer: Self-pay

## 2017-05-30 ENCOUNTER — Telehealth: Payer: Self-pay

## 2017-05-30 ENCOUNTER — Other Ambulatory Visit: Payer: Self-pay

## 2017-05-30 DIAGNOSIS — Z1211 Encounter for screening for malignant neoplasm of colon: Secondary | ICD-10-CM

## 2017-06-12 NOTE — Telephone Encounter (Signed)
Waiting on pt to call back to complete med list.

## 2017-06-27 NOTE — Telephone Encounter (Signed)
Gastroenterology Pre-Procedure Review  Request Date: 05/30/2017 Requesting Physician: Delman Cheadle, PA  PATIENT REVIEW QUESTIONS: The patient responded to the following health history questions as indicated:    1. Diabetes Melitis: no 2. Joint replacements in the past 12 months: no 3. Major health problems in the past 3 months: no 4. Has an artificial valve or MVP: no 5. Has a defibrillator: no 6. Has been advised in past to take antibiotics in advance of a procedure like teeth cleaning: no 7. Family history of colon cancer: no  8. Alcohol Use: no 9. History of sleep apnea: no  10. History of coronary artery or other vascular stents placed within the last 12 months: no    MEDICATIONS & ALLERGIES:    Patient reports the following regarding taking any blood thinners:   Plavix? no Aspirin? no Coumadin? no Brilinta? no Xarelto? no Eliquis? no Pradaxa? no Savaysa? no Effient? no  Patient confirms/reports the following medications:  Current Outpatient Prescriptions  Medication Sig Dispense Refill  . amLODipine (NORVASC) 5 MG tablet Take 5 mg by mouth daily.    . citalopram (CELEXA) 40 MG tablet Take 40 mg by mouth daily.    Marland Kitchen docusate sodium 100 MG CAPS Take 100 mg by mouth 2 (two) times daily. (Patient not taking: Reported on 05/30/2017) 10 capsule 0  . estradiol (ESTRACE) 2 MG tablet Take 1 tablet (2 mg total) by mouth daily. (Patient not taking: Reported on 05/30/2017) 30 tablet 11  . hydrochlorothiazide (HYDRODIURIL) 25 MG tablet Take 25 mg by mouth daily.    . IRON PO Take 1 tablet by mouth daily.    Marland Kitchen oxyCODONE-acetaminophen (PERCOCET/ROXICET) 5-325 MG per tablet Take 2 tablets by mouth every 4 (four) hours as needed for severe pain (moderate to severe pain (when tolerating fluids)). (Patient not taking: Reported on 05/30/2017) 30 tablet 0  . phentermine 30 MG capsule Take 1 capsule (30 mg total) by mouth every morning. (Patient not taking: Reported on 05/30/2017) 30 capsule 1   . triamterene-hydrochlorothiazide (MAXZIDE-25) 37.5-25 MG per tablet Take 1 tablet by mouth daily.     No current facility-administered medications for this visit.     Patient confirms/reports the following allergies:  Allergies  Allergen Reactions  . Aleve [Naproxen Sodium] Hives    No orders of the defined types were placed in this encounter.   AUTHORIZATION INFORMATION Primary Insurance:   ID #:   Group #:  Pre-Cert / Auth required:  Pre-Cert / Auth #:   Secondary Insurance:   ID #:   Group #:  Pre-Cert / Auth required:  Pre-Cert / Auth #:   SCHEDULE INFORMATION: Procedure has been scheduled as follows:  Date:  07/13/2017           Time:  9:30 AM Location: [facility] Short Stay  This Gastroenterology Pre-Precedure Review Form is being routed to the following provider(s): Barney Drain, MD

## 2017-07-05 ENCOUNTER — Encounter: Payer: Self-pay | Admitting: Gastroenterology

## 2017-07-05 NOTE — Telephone Encounter (Signed)
SUPREP SPLIT DOSING- CLEAR LIQUIDS WITH BREAKFAST.  HOLD HYDROCHLORTHIAZIDE on day before and day of colonoscopy.

## 2017-07-06 MED ORDER — NA SULFATE-K SULFATE-MG SULF 17.5-3.13-1.6 GM/177ML PO SOLN: 1.0000 | ORAL | 0 refills | Status: DC

## 2017-07-06 NOTE — Telephone Encounter (Signed)
Rx sent to the pharmacy and instructions mailed to pt.  

## 2017-07-06 NOTE — Telephone Encounter (Signed)
NO PA is needed for TCS 

## 2017-07-07 NOTE — Telephone Encounter (Signed)
Called from [facility] asking about the pt's insurance. I called back @ [phone] and left message. Insurance is [employer] [id number].  Ask her to please call me back if any more questions.

## 2017-07-13 ENCOUNTER — Ambulatory Visit (HOSPITAL_COMMUNITY)
Admission: RE | Admit: 2017-07-13 | Discharge: 2017-07-13 | Disposition: A | Discharge status: Home / Self Care | Payer: Managed Care, Other (non HMO) | Source: Ambulatory Visit | Attending: Gastroenterology | Admitting: Gastroenterology

## 2017-07-13 ENCOUNTER — Encounter (HOSPITAL_COMMUNITY): Payer: Self-pay

## 2017-07-13 ENCOUNTER — Encounter (HOSPITAL_COMMUNITY)
Admission: RE | Disposition: A | Discharge status: Home / Self Care | Payer: Self-pay | Source: Ambulatory Visit | Attending: Gastroenterology

## 2017-07-13 DIAGNOSIS — K644 Residual hemorrhoidal skin tags: Secondary | ICD-10-CM | POA: Insufficient documentation

## 2017-07-13 DIAGNOSIS — Z1212 Encounter for screening for malignant neoplasm of rectum: Secondary | ICD-10-CM

## 2017-07-13 DIAGNOSIS — Z1211 Encounter for screening for malignant neoplasm of colon: Secondary | ICD-10-CM | POA: Diagnosis present

## 2017-07-13 DIAGNOSIS — D122 Benign neoplasm of ascending colon: Secondary | ICD-10-CM | POA: Diagnosis not present

## 2017-07-13 DIAGNOSIS — Z7902 Long term (current) use of antithrombotics/antiplatelets: Secondary | ICD-10-CM | POA: Insufficient documentation

## 2017-07-13 DIAGNOSIS — Z792 Long term (current) use of antibiotics: Secondary | ICD-10-CM | POA: Insufficient documentation

## 2017-07-13 DIAGNOSIS — Z79899 Other long term (current) drug therapy: Secondary | ICD-10-CM | POA: Insufficient documentation

## 2017-07-13 DIAGNOSIS — Z886 Allergy status to analgesic agent status: Secondary | ICD-10-CM | POA: Insufficient documentation

## 2017-07-13 DIAGNOSIS — K648 Other hemorrhoids: Secondary | ICD-10-CM | POA: Diagnosis not present

## 2017-07-13 DIAGNOSIS — K5730 Diverticulosis of large intestine without perforation or abscess without bleeding: Secondary | ICD-10-CM | POA: Diagnosis not present

## 2017-07-13 DIAGNOSIS — Z122 Encounter for screening for malignant neoplasm of respiratory organs: Secondary | ICD-10-CM | POA: Diagnosis not present

## 2017-07-13 DIAGNOSIS — Z793 Long term (current) use of hormonal contraceptives: Secondary | ICD-10-CM | POA: Insufficient documentation

## 2017-07-13 DIAGNOSIS — M109 Gout, unspecified: Secondary | ICD-10-CM | POA: Diagnosis not present

## 2017-07-13 HISTORY — PX: COLONOSCOPY: SHX5424

## 2017-07-13 SURGERY — COLONOSCOPY
Anesthesia: Moderate Sedation

## 2017-07-13 MED ORDER — MEPERIDINE HCL 100 MG/ML IJ SOLN
INTRAMUSCULAR | Status: AC
  Filled 2017-07-13: qty 2

## 2017-07-13 MED ORDER — STERILE WATER FOR IRRIGATION IR SOLN
Status: DC | PRN
  Administered 2017-07-13: 10:00:00

## 2017-07-13 MED ORDER — SODIUM CHLORIDE 0.9 % IV SOLN
INTRAVENOUS | Status: DC
  Administered 2017-07-13: 09:00:00 via INTRAVENOUS

## 2017-07-13 MED ORDER — MIDAZOLAM HCL 5 MG/5ML IJ SOLN
INTRAMUSCULAR | Status: AC
  Filled 2017-07-13: qty 10

## 2017-07-13 MED ORDER — MEPERIDINE HCL 100 MG/ML IJ SOLN
INTRAMUSCULAR | Status: DC | PRN
  Administered 2017-07-13 (×3): 25 mg via INTRAVENOUS

## 2017-07-13 MED ORDER — MIDAZOLAM HCL 5 MG/5ML IJ SOLN
INTRAMUSCULAR | Status: DC | PRN
  Administered 2017-07-13 (×3): 2 mg via INTRAVENOUS

## 2017-07-13 NOTE — H&P (Signed)
Primary Care Physician:  Soyla Dryer, PA-C Primary Gastroenterologist:  Dr. Oneida Alar  Pre-Procedure History & Physical: HPI:  Renee Vargas is a 56 y.o. female here for [place].  Past Medical History:  Diagnosis Date  . Anemia   . Anxiety   . History of gout   . Hypertension     Past Surgical History:  Procedure Laterality Date  . ABDOMINAL HYSTERECTOMY N/A 03/12/2014   Procedure: SUPRACERVICAL HYSTERECTOMY;  Surgeon: Jonnie Kind, MD;  Location: [facility];  Service: Gynecology;  Laterality: N/A;  . fatty mass removed Left    hip  . SALPINGOOPHORECTOMY Bilateral 03/12/2014   Procedure: BILATERAL SALPINGO OOPHORECTOMY;  Surgeon: Jonnie Kind, MD;  Location: [facility];  Service: Gynecology;  Laterality: Bilateral;  . TUBAL LIGATION      Prior to Admission medications   Medication Sig Start Date End Date Taking? Authorizing Provider  amLODipine (NORVASC) 5 MG tablet Take 5 mg by mouth daily.   Yes [provider]  docusate sodium 100 MG CAPS Take 100 mg by mouth 2 (two) times daily. 03/14/14  Yes Jonnie Kind, MD  estradiol (ESTRACE) 2 MG tablet Take 1 tablet (2 mg total) by mouth daily. 03/14/14  Yes Jonnie Kind, MD  hydrochlorothiazide (HYDRODIURIL) 25 MG tablet Take 25 mg by mouth daily.   Yes [provider]  Na Sulfate-K Sulfate-Mg Sulf (SUPREP BOWEL PREP KIT) 17.5-3.13-1.6 GM/180ML SOLN Take 1 kit by mouth as directed. 07/06/17  Yes Demetres Prochnow, Marga Melnick, MD  triamterene-hydrochlorothiazide (MAXZIDE-25) 37.5-25 MG per tablet Take 1 tablet by mouth daily.   Yes [provider]  citalopram (CELEXA) 40 MG tablet Take 40 mg by mouth daily.    [provider]  IRON PO Take 1 tablet by mouth daily.    [provider]  oxyCODONE-acetaminophen (PERCOCET/ROXICET) 5-325 MG per tablet Take 2 tablets by mouth every 4 (four) hours as needed for severe pain (moderate to severe pain (when tolerating fluids)). Patient not taking:  Reported on 05/30/2017 03/14/14   Jonnie Kind, MD  phentermine 30 MG capsule Take 1 capsule (30 mg total) by mouth every morning. Patient not taking: Reported on 05/30/2017 03/14/14   Jonnie Kind, MD    Allergies as of 05/30/2017 - Review Complete 05/30/2017  Allergen Reaction Noted  . Aleve [naproxen sodium] Hives 03/05/2014    Family History  Problem Relation Age of Onset  . Hypertension Mother   . Hypertension Sister     Social History   Social History  . Marital status: Married    Spouse name: N/A  . Number of children: N/A  . Years of education: N/A   Occupational History  . Not on file.   Social History Main Topics  . Smoking status: Never Smoker  . Smokeless tobacco: Never Used  . Alcohol use No  . Drug use: No  . Sexual activity: Yes    Birth control/ protection: None   Other Topics Concern  . Not on file   Social History Narrative  . No narrative on file    Review of Systems: See HPI, otherwise negative ROS   Physical Exam: BP 119/76   Pulse 98   Temp 98.9 F (37.2 C) (Oral)   Resp 16   Ht 5' 6"  (1.676 m)   Wt (!) 315 lb (142.9 kg)   LMP 02/21/2014   SpO2 98%   BMI 50.84 kg/m  General:   Alert,  pleasant and cooperative in NAD  Head:  Normocephalic and atraumatic. Neck:  Supple; Lungs:  Clear throughout to auscultation.    Heart:  Regular rate and rhythm. Abdomen:  Soft, nontender and nondistended. Normal bowel sounds, without guarding, and without rebound.   Neurologic:  Alert and  oriented x4;  grossly normal neurologically.  Impression/Plan:     SCREENING  Plan:  1. TCS TODAY. DISCUSSED PROCEDURE, BENEFITS, & RISKS: < 1% chance of medication reaction, bleeding, perforation, or rupture of spleen/liver.

## 2017-07-13 NOTE — Discharge Instructions (Signed)
You have small internal AND EXTERNAL HEMORRHOIDS and diverticulosis IN YOUR LEFT COLON. YOU HAD ONE POLYP REMOVED.   DRINK WATER TO KEEP YOUR URINE LIGHT YELLOW.  FOLLOW A HIGH FIBER DIET. AVOID ITEMS THAT CAUSE BLOATING. See info below.YOU SHOULD TRANSITION TO A PLANT BASED DIET-NO MEAT OR DAIRY for 6 MOS. I RECOMMEND THE BOOK, "PREVENT AND REVERSE HEART DISEASE", CALDWELL ESSELSTYN JR., MD. PAGES 120-121 [place] THE DIET AND THE LAST HALF OF THE BOOK HAS QUICK AND EASY RECIPES FOR BREAKFAST, LUNCH, AND DINNER ARE AFTER PAGE 127.   CONTINUE YOUR WEIGHT LOSS EFFORTS.  WHILE I DO NOT WANT TO ALARM YOU, YOUR BODY MASS INDEX (BMI) IS OVER 40 WHICH MEANS YOU ARE MORBIDLY OBESE. OBESITY ACTIVATES CANCER GENES. MORBID OBESITY SHORTENS YOUR LIFE EXPECTANCY 10 YEARS. OBESITY IS ASSOCIATED WITH AN INCREASE RISK FOR ALL CANCERS, INCLUDING ESOPHAGEAL AND COLON CANCER. A WEIGHT OF 185 LBS  WILL GET YOUR BMI UNDER 30.  YOUR BIOPSY RESULTS WILL BE AVAILABLE IN MY CHART AFTER JUL 29  AND MY OFFICE WILL CONTACT YOU IN 10-14 DAYS WITH YOUR RESULTS.   Next colonoscopy in 5-10 years.  Colonoscopy Care After Read the instructions outlined below and refer to this sheet in the next week. These discharge instructions provide you with general information on caring for yourself after you leave the hospital. While your treatment has been planned according to the most current medical practices available, unavoidable complications occasionally occur. If you have any problems or questions after discharge, call DR. Dameer Speiser, [phone].  ACTIVITY  You may resume your regular activity, but move at a slower pace for the next 24 hours.   Take frequent rest periods for the next 24 hours.   Walking will help get rid of the air and reduce the bloated feeling in your belly (abdomen).   No driving for 24 hours (because of the medicine (anesthesia) used during the test).   You may shower.   Do not sign any  important legal documents or operate any machinery for 24 hours (because of the anesthesia used during the test).    NUTRITION  Drink plenty of fluids.   You may resume your normal diet as instructed by your doctor.   Begin with a light meal and progress to your normal diet. Heavy or fried foods are harder to digest and may make you feel sick to your stomach (nauseated).   Avoid alcoholic beverages for 24 hours or as instructed.    MEDICATIONS  You may resume your normal medications.   WHAT YOU CAN EXPECT TODAY  Some feelings of bloating in the abdomen.   Passage of more gas than usual.   Spotting of blood in your stool or on the toilet paper  .  IF YOU HAD POLYPS REMOVED DURING THE COLONOSCOPY:  Eat a soft diet IF YOU HAVE NAUSEA, BLOATING, ABDOMINAL PAIN, OR VOMITING.    FINDING OUT THE RESULTS OF YOUR TEST Not all test results are available during your visit. DR. Oneida Alar WILL CALL YOU WITHIN 14 DAYS OF YOUR PROCEDUE WITH YOUR RESULTS. Do not assume everything is normal if you have not heard from DR. Aulden Calise, CALL HER OFFICE AT [phone].  SEEK IMMEDIATE MEDICAL ATTENTION AND CALL THE OFFICE: [phone] IF:  You have more than a spotting of blood in your stool.   Your belly is swollen (abdominal distention).   You are nauseated or vomiting.   You have a temperature over 101F.   You have  abdominal pain or discomfort that is severe or gets worse throughout the day.  High-Fiber Diet A high-fiber diet changes your normal diet to include more whole grains, legumes, fruits, and vegetables. Changes in the diet involve replacing refined carbohydrates with unrefined foods. The calorie level of the diet is essentially unchanged. The Dietary Reference Intake (recommended amount) for adult males is 38 grams per day. For adult females, it is 25 grams per day. Pregnant and lactating women should consume 28 grams of fiber per day. Fiber is the intact part of a plant that is  not broken down during digestion. Functional fiber is fiber that has been isolated from the plant to provide a beneficial effect in the body. PURPOSE  Increase stool bulk.   Ease and regulate bowel movements.   Lower cholesterol.   REDUCE RISK OF COLON CANCER  INDICATIONS THAT YOU NEED MORE FIBER  Constipation and hemorrhoids.   Uncomplicated diverticulosis (intestine condition) and irritable bowel syndrome.   Weight management.   As a protective measure against hardening of the arteries (atherosclerosis), diabetes, and cancer.   GUIDELINES FOR INCREASING FIBER IN THE DIET  Start adding fiber to the diet slowly. A gradual increase of about 5 more grams (2 slices of whole-wheat bread, 2 servings of most fruits or vegetables, or 1 bowl of high-fiber cereal) per day is best. Too rapid an increase in fiber may result in constipation, flatulence, and bloating.   Drink enough water and fluids to keep your urine clear or pale yellow. Water, juice, or caffeine-free drinks are recommended. Not drinking enough fluid may cause constipation.   Eat a variety of high-fiber foods rather than one type of fiber.   Try to increase your intake of fiber through using high-fiber foods rather than fiber pills or supplements that contain small amounts of fiber.   The goal is to change the types of food eaten. Do not supplement your present diet with high-fiber foods, but replace foods in your present diet.   INCLUDE A VARIETY OF FIBER SOURCES  Replace refined and processed grains with whole grains, canned fruits with fresh fruits, and incorporate other fiber sources. White rice, white breads, and most bakery goods contain little or no fiber.   Brown whole-grain rice, buckwheat oats, and many fruits and vegetables are all good sources of fiber. These include: broccoli, Brussels sprouts, cabbage, cauliflower, beets, sweet potatoes, white potatoes (skin on), carrots, tomatoes, eggplant, squash, berries,  fresh fruits, and dried fruits.   Cereals appear to be the richest source of fiber. Cereal fiber is found in whole grains and bran. Bran is the fiber-rich outer coat of cereal grain, which is largely removed in refining. In whole-grain cereals, the bran remains. In breakfast cereals, the largest amount of fiber is found in those with "bran" in their names. The fiber content is sometimes indicated on the label.   You may need to include additional fruits and vegetables each day.   In baking, for 1 cup white flour, you may use the following substitutions:   1 cup whole-wheat flour minus 2 tablespoons.   1/2 cup white flour plus 1/2 cup whole-wheat flour.   Polyps, Colon  A polyp is extra tissue that grows inside your body. Colon polyps grow in the large intestine. The large intestine, also called the colon, is part of your digestive system. It is a long, hollow tube at the end of your digestive tract where your body makes and stores stool. Most polyps are not dangerous. They  are benign. This means they are not cancerous. But over time, some types of polyps can turn into cancer. Polyps that are smaller than a pea are usually not harmful. But larger polyps could someday become or may already be cancerous. To be safe, doctors remove all polyps and test them.   WHO GETS POLYPS? Anyone can get polyps, but certain people are more likely than others. You may have a greater chance of getting polyps if:  You are over 50.   You have had polyps before.   Someone in your family has had polyps.   Someone in your family has had cancer of the large intestine.   Find out if someone in your family has had polyps. You may also be more likely to get polyps if you:   Eat a lot of fatty foods   Smoke   Drink alcohol   Do not exercise  Eat too much     PREVENTION There is not one sure way to prevent polyps. You might be able to lower your risk of getting them if you:  Eat more fruits and vegetables  and less fatty food.   Do not smoke.   Avoid alcohol.   Exercise every day.   Lose weight if you are overweight.   Eating more calcium and folate can also lower your risk of getting polyps. Some foods that are rich in calcium are milk, cheese, and broccoli. Some foods that are rich in folate are chickpeas, kidney beans, and spinach.    Diverticulosis Diverticulosis is a common condition that develops when small pouches (diverticula) form in the wall of the colon. The risk of diverticulosis increases with age. It happens more often in people who eat a low-fiber diet. Most individuals with diverticulosis have no symptoms. Those individuals with symptoms usually experience belly (abdominal) pain, constipation, or loose stools (diarrhea).  HOME CARE INSTRUCTIONS  Increase the amount of fiber in your diet as directed by your caregiver or dietician. This may reduce symptoms of diverticulosis.   Drink at least 6 to 8 glasses of water each day to prevent constipation.   Try not to strain when you have a bowel movement.   Avoiding nuts and seeds to prevent complications is NOT NECESSARY.   FOODS HAVING HIGH FIBER CONTENT INCLUDE:  Fruits. Apple, peach, pear, tangerine, raisins, prunes.   Vegetables. Brussels sprouts, asparagus, broccoli, cabbage, carrot, cauliflower, romaine lettuce, spinach, summer squash, tomato, winter squash, zucchini.   Starchy Vegetables. Baked beans, kidney beans, lima beans, split peas, lentils, potatoes (with skin).   Grains. Whole wheat bread, brown rice, bran flake cereal, plain oatmeal, white rice, shredded wheat, bran muffins.    SEEK IMMEDIATE MEDICAL CARE IF:  You develop increasing pain or severe bloating.   You have an oral temperature above 101F.   You develop vomiting or bowel movements that are bloody or black.   Hemorrhoids Hemorrhoids are dilated (enlarged) veins around the rectum. Sometimes clots will form in the veins. This makes them  swollen and painful. These are called thrombosed hemorrhoids. Causes of hemorrhoids include:  Constipation.   Straining to have a bowel movement.   HEAVY LIFTING  HOME CARE INSTRUCTIONS  Eat a well balanced diet and drink 6 to 8 glasses of water every day to avoid constipation. You may also use a bulk laxative.   Avoid straining to have bowel movements.   Keep anal area dry and clean.   Do not use a donut shaped pillow or sit on  the toilet for long periods. This increases blood pooling and pain.   Move your bowels when your body has the urge; this will require less straining and will decrease pain and pressure.

## 2017-07-13 NOTE — Op Note (Signed)
[facility] Patient Name: Renee Vargas Procedure Date: 07/13/2017 9:54 AM MRN: [id number] Date of Birth: [birth year] Attending MD: Barney Drain , MD CSN: [id number] Age: 55 Admit Type: Outpatient Procedure:                Colonoscopy WITH SNARE POLYPECTOMY Indications:              Screening for colorectal malignant neoplasm Providers:                Barney Drain, MD, Lurline Del, RN, Aram Candela Referring MD:              Medicines:                Meperidine 75 mg IV, Midazolam 6 mg IV Complications:            No immediate complications. Estimated Blood Loss:     Estimated blood loss: none. Procedure:                Pre-Anesthesia Assessment:                           - Prior to the procedure, a History and Physical                            was performed, and patient medications and                            allergies were reviewed. The patient's tolerance of                            previous anesthesia was also reviewed. The risks                            and benefits of the procedure and the sedation                            options and risks were discussed with the patient.                            All questions were answered, and informed consent                            was obtained. Prior Anticoagulants: The patient has                            taken no previous anticoagulant or antiplatelet                            agents. ASA Grade Assessment: II - A patient with                            mild systemic disease. After reviewing the risks                            and benefits, the patient was deemed in  satisfactory condition to undergo the procedure.                            After obtaining informed consent, the colonoscope                            was passed under direct vision. Throughout the                            procedure, the patient's blood pressure, pulse, and                            oxygen saturations were  monitored continuously. The                            EC-[id number] ([id number]) scope was introduced through                            the anus and advanced to the the cecum, identified                            by appendiceal orifice and ileocecal valve. The                            colonoscopy was somewhat difficult due to a                            tortuous colon. Successful completion of the                            procedure was aided by COLOWRAP. The patient                            tolerated the procedure fairly well. The quality of                            the bowel preparation was excellent. The ileocecal                            valve, appendiceal orifice, and rectum were                            photographed. Scope In: 10:31:50 AM Scope Out: 10:43:46 AM Scope Withdrawal Time: 0 hours 8 minutes 35 seconds  Total Procedure Duration: 0 hours 11 minutes 56 seconds  Findings:      A 8 mm polyp was found in the proximal ascending colon. The polyp was       sessile. The polyp was removed with a hot snare. Resection and retrieval       were complete.      A few large-mouthed diverticula were found in the sigmoid colon and       descending colon.      External and internal hemorrhoids were found during retroflexion. The       hemorrhoids were small. Impression:               -  One 8 mm polyp in the proximal ascending colon,                            removed with a hot snare. Resected and retrieved.                           - Diverticulosis in the sigmoid colon and in the                            descending colon.                           - External and internal hemorrhoids. Moderate Sedation:      Moderate (conscious) sedation was administered by the endoscopy nurse       and supervised by the endoscopist. The following parameters were       monitored: oxygen saturation, heart rate, blood pressure, and response       to care. Total physician intraservice time was 23  minutes. Recommendation:           - Repeat colonoscopy in 5-10 years for surveillance.                           - High fiber diet.                           - Continue present medications.                           - Await pathology results.                           - Patient has a contact number available for                            emergencies. The signs and symptoms of potential                            delayed complications were discussed with the                            patient. Return to normal activities tomorrow.                            Written discharge instructions were provided to the                            patient. Procedure Code(s):        --- Professional ---                           [phone], Colonoscopy, flexible; with removal of                            tumor(s), polyp(s), or other lesion(s) by snare  technique                           [id number], Moderate sedation services provided by the                            same physician or other qualified health care                            professional performing the diagnostic or                            therapeutic service that the sedation supports,                            requiring the presence of an independent trained                            observer to assist in the monitoring of the                            patient's level of consciousness and physiological                            status; initial 15 minutes of intraservice time,                            patient age 79 years or older                           [phone], Moderate sedation services; each additional                            15 minutes intraservice time Diagnosis Code(s):        --- Professional ---                           Z12.11, Encounter for screening for malignant                            neoplasm of colon                           D12.2, Benign neoplasm of ascending colon                            K64.8, Other hemorrhoids                           K57.30, Diverticulosis of large intestine without                            perforation or abscess without bleeding CPT copyright 2016 [employer]. All rights reserved. The codes documented in this report are preliminary and upon coder review may  be revised to meet current compliance requirements. Barney Drain, MD Barney Drain,  MD 07/13/2017 10:55:55 AM This report has been signed electronically. Number of Addenda: 0

## 2017-07-17 ENCOUNTER — Telehealth: Payer: Self-pay

## 2017-07-17 ENCOUNTER — Encounter (HOSPITAL_COMMUNITY): Payer: Self-pay | Admitting: Gastroenterology

## 2017-07-17 NOTE — Telephone Encounter (Signed)
I made the patient aware of the book Dr. Oneida Alar would like her to purchase.

## 2017-07-17 NOTE — Telephone Encounter (Signed)
Pt said Dr. Oneida Alar was going to give her a plant based diet. I have not gotten anything from Dr. Oneida Alar related to this and I do not see it mentioned in her procedure note. Dr. Oneida Alar, please advise!

## 2017-07-17 NOTE — Telephone Encounter (Signed)
LMOM for a return call.  

## 2017-07-17 NOTE — Telephone Encounter (Signed)
PLEASE CALL PT. I wrote the name of the book on her AVS. I RECOMMEND THE BOOK, "PREVENT AND REVERSE HEART DISEASE", CALDWELL ESSELSTYN JR., MD. PAGE 120-121 CLEARLY STATE THE RULES AND QUICK AND EASY RECIPES FOR BREAKFAST, LUNCH, AND DINNER ARE AFTER P 127.

## 2017-08-01 ENCOUNTER — Telehealth: Payer: Self-pay | Admitting: Gastroenterology

## 2017-08-01 NOTE — Telephone Encounter (Signed)
LMOM for a return call. See Dr. Artis Flock documentation of 07/15/2017 Pt had one simple adenoma removed and next colonoscopy in 5-10 years.

## 2017-08-01 NOTE — Telephone Encounter (Signed)
[phone] PATIENT CALLED INQUIRING ABOUT HER TCS RESULTS, WAS DONE LAST MONTH

## 2017-08-01 NOTE — Telephone Encounter (Signed)
Pt is aware of results. 

## 2017-08-13 ENCOUNTER — Encounter (HOSPITAL_COMMUNITY): Payer: Self-pay

## 2017-08-13 ENCOUNTER — Emergency Department (HOSPITAL_COMMUNITY)
Admission: EM | Admit: 2017-08-13 | Discharge: 2017-08-13 | Disposition: A | Discharge status: Home / Self Care | Payer: Managed Care, Other (non HMO) | Attending: Emergency Medicine | Admitting: Emergency Medicine

## 2017-08-13 DIAGNOSIS — Y999 Unspecified external cause status: Secondary | ICD-10-CM | POA: Diagnosis not present

## 2017-08-13 DIAGNOSIS — Z79899 Other long term (current) drug therapy: Secondary | ICD-10-CM | POA: Diagnosis not present

## 2017-08-13 DIAGNOSIS — Y929 Unspecified place or not applicable: Secondary | ICD-10-CM | POA: Insufficient documentation

## 2017-08-13 DIAGNOSIS — Y939 Activity, unspecified: Secondary | ICD-10-CM | POA: Diagnosis not present

## 2017-08-13 DIAGNOSIS — S3992XA Unspecified injury of lower back, initial encounter: Secondary | ICD-10-CM | POA: Diagnosis present

## 2017-08-13 DIAGNOSIS — S39012A Strain of muscle, fascia and tendon of lower back, initial encounter: Secondary | ICD-10-CM

## 2017-08-13 DIAGNOSIS — I10 Essential (primary) hypertension: Secondary | ICD-10-CM | POA: Diagnosis not present

## 2017-08-13 DIAGNOSIS — X509XXA Other and unspecified overexertion or strenuous movements or postures, initial encounter: Secondary | ICD-10-CM | POA: Insufficient documentation

## 2017-08-13 DIAGNOSIS — M62830 Muscle spasm of back: Secondary | ICD-10-CM | POA: Diagnosis not present

## 2017-08-13 MED ORDER — CYCLOBENZAPRINE HCL 10 MG PO TABS
10.0000 mg | ORAL_TABLET | Freq: Once | ORAL | Status: AC
  Administered 2017-08-13: 10 mg via ORAL
  Filled 2017-08-13: qty 1

## 2017-08-13 MED ORDER — OXYCODONE-ACETAMINOPHEN 5-325 MG PO TABS
1.0000 | ORAL_TABLET | Freq: Once | ORAL | Status: AC
  Administered 2017-08-13: 1 via ORAL
  Filled 2017-08-13: qty 1

## 2017-08-13 MED ORDER — DEXAMETHASONE SODIUM PHOSPHATE 4 MG/ML IJ SOLN
10.0000 mg | Freq: Once | INTRAMUSCULAR | Status: AC
  Administered 2017-08-13: 10 mg via INTRAMUSCULAR
  Filled 2017-08-13: qty 3

## 2017-08-13 MED ORDER — TRAMADOL HCL 50 MG PO TABS: 50.0000 mg | ORAL_TABLET | Freq: Four times a day (QID) | ORAL | 0 refills | Status: DC | PRN

## 2017-08-13 MED ORDER — CYCLOBENZAPRINE HCL 10 MG PO TABS: 10.0000 mg | ORAL_TABLET | Freq: Three times a day (TID) | ORAL | 0 refills | Status: DC | PRN

## 2017-08-13 NOTE — ED Provider Notes (Signed)
[facility] DEPT Provider Note   CSN: [id number] Arrival date & time: 08/13/17  0240     History   Chief Complaint Chief Complaint  Patient presents with  . Back Pain    HPI Renee Vargas is a 56 y.o. female.  HPI   Renee Vargas is a 56 y.o. female with history of low back pain and frequent muscle spasms to her lower back,  presents to the Emergency Department complaining of worsening of her low back pain for 1 day. She states that she bent over to adjust her DVD player and felt a sharp, stabbing type pain to her lower back. She complains of pain with weightbearing and with certain movements. Pain improves with rest. She took Mobic without relief. She states that she normally takes Flexeril for symptoms like this, but has recently ran out of her prescription. She denies pain radiating into her lower extremities, abdominal pain, urine or bowel changes,dysuria, fever or chills.   Past Medical History:  Diagnosis Date  . Anemia   . Anxiety   . History of gout   . Hypertension     Patient Active Problem List   Diagnosis Date Noted  . Special screening for malignant neoplasms, colon   . Postoperative state 03/26/2014  . Status post abdominal supracervical subtotal hysterectomy 03/12/2014  . Fibroids, intramural 02/28/2014  . Menorrhagia 02/28/2014  . Fibroids 02/19/2014  . Anemia 02/19/2014  . Heavy menstrual bleeding 02/19/2014  . Fibroid uterus 11/07/2013    Past Surgical History:  Procedure Laterality Date  . ABDOMINAL HYSTERECTOMY N/A 03/12/2014   Procedure: SUPRACERVICAL HYSTERECTOMY;  Surgeon: Jonnie Kind, MD;  Location: [facility];  Service: Gynecology;  Laterality: N/A;  . COLONOSCOPY N/A 07/13/2017   Procedure: COLONOSCOPY;  Surgeon: Danie Binder, MD;  Location: [facility];  Service: Endoscopy;  Laterality: N/A;  . fatty mass removed Left    hip  . SALPINGOOPHORECTOMY Bilateral 03/12/2014   Procedure: BILATERAL SALPINGO OOPHORECTOMY;  Surgeon: Jonnie Kind, MD;  Location: [facility];  Service: Gynecology;  Laterality: Bilateral;  . TUBAL LIGATION      OB History    No data available       Home Medications    Prior to Admission medications   Medication Sig Start Date End Date Taking? Authorizing Provider  amLODipine (NORVASC) 5 MG tablet Take 5 mg by mouth daily.    [provider]  citalopram (CELEXA) 40 MG tablet Take 40 mg by mouth daily.    [provider]  docusate sodium 100 MG CAPS Take 100 mg by mouth 2 (two) times daily. 03/14/14   Jonnie Kind, MD  estradiol (ESTRACE) 2 MG tablet Take 1 tablet (2 mg total) by mouth daily. 03/14/14   Jonnie Kind, MD  hydrochlorothiazide (HYDRODIURIL) 25 MG tablet Take 25 mg by mouth daily.    [provider]  oxyCODONE-acetaminophen (PERCOCET/ROXICET) 5-325 MG per tablet Take 2 tablets by mouth every 4 (four) hours as needed for severe pain (moderate to severe pain (when tolerating fluids)). Patient not taking: Reported on 05/30/2017 03/14/14   Jonnie Kind, MD  phentermine 30 MG capsule Take 1 capsule (30 mg total) by mouth every morning. Patient not taking: Reported on 05/30/2017 03/14/14   Jonnie Kind, MD  triamterene-hydrochlorothiazide (MAXZIDE-25) 37.5-25 MG per tablet Take 1 tablet by mouth daily.    [provider]    Family History Family History  Problem Relation Age of Onset  .  Hypertension Mother   . Hypertension Sister     Social History Social History  Substance Use Topics  . Smoking status: Never Smoker  . Smokeless tobacco: Never Used  . Alcohol use No     Allergies   Aleve [naproxen sodium]   Review of Systems Review of Systems  Constitutional: Negative for fever.  Respiratory: Negative for shortness of breath.   Gastrointestinal: Negative for abdominal pain, constipation and vomiting.  Genitourinary: Negative for decreased urine volume, difficulty urinating, dysuria, flank pain and hematuria.    Musculoskeletal: Positive for back pain. Negative for joint swelling.  Skin: Negative for rash.  Neurological: Negative for weakness and numbness.  All other systems reviewed and are negative.    Physical Exam Updated Vital Signs BP (!) 157/85 (BP Location: Left Wrist)   Pulse 64   Temp 98.1 F (36.7 C) (Oral)   Resp 18   Ht 5\' 6"  (1.676 m)   Wt (!) 140.6 kg (310 lb)   LMP 02/21/2014   SpO2 98%   BMI 50.04 kg/m   Physical Exam  Constitutional: She is oriented to person, place, and time. She appears well-developed and well-nourished. No distress.  HENT:  Head: Normocephalic and atraumatic.  Neck: Normal range of motion. Neck supple.  Cardiovascular: Normal rate, regular rhythm, normal heart sounds and intact distal pulses.   No murmur heard. Pulmonary/Chest: Effort normal and breath sounds normal. No respiratory distress.  Abdominal: Soft. She exhibits no distension. There is no tenderness. There is no guarding.  Musculoskeletal: She exhibits tenderness. She exhibits no edema.       Lumbar back: She exhibits tenderness and pain. She exhibits normal range of motion, no swelling, no deformity, no laceration and normal pulse.  ttp of the bilateral lumbar paraspinal muscles and SI joints.  No spinal tenderness.  Pt has 5/5 strength against resistance of bilateral lower extremities. Neg bilateral SLR    Neurological: She is alert and oriented to person, place, and time. She has normal strength. No sensory deficit. She exhibits normal muscle tone. Coordination and gait normal.  Reflex Scores:      Patellar reflexes are 2+ on the right side and 2+ on the left side.      Achilles reflexes are 2+ on the right side and 2+ on the left side. Skin: Skin is warm and dry. Capillary refill takes less than 2 seconds. No rash noted.  Nursing note and vitals reviewed.    ED Treatments / Results  Labs (all labs ordered are listed, but only abnormal results are displayed) Labs Reviewed - No  data to display  EKG  EKG Interpretation None       Radiology No results found.  Procedures Procedures (including critical care time)  Medications Ordered in ED Medications - No data to display   Initial Impression / Assessment and Plan / ED Course  I have reviewed the triage vital signs and the nursing notes.  Pertinent labs & imaging results that were available during my care of the patient were reviewed by me and considered in my medical decision making (see chart for details).     Patient ambulates with a slow but steady gait. No focal neuro deficits. History of low back pain and muscle spasms. No concerning symptoms for emergent neurological process at this time. Patient agrees to treatment plan with pain medication and muscle relaxer. She also agrees to PCP follow-up if needed.   Final Clinical Impressions(s) / ED Diagnoses   Final diagnoses:  Strain of lumbar region, initial encounter  Muscle spasm of back    New Prescriptions New Prescriptions   No medications on file     Kem Parkinson, Hershal Coria 08/13/17 1123    Milton Ferguson, MD 08/13/17 1521

## 2017-08-13 NOTE — ED Triage Notes (Signed)
Pt bending over to get something yesterday and beganexperiencing lower back pain. Pt has problem with back spasms and took mobic and did not relieve pain. Pt reports out of flexeril

## 2017-08-13 NOTE — Discharge Instructions (Signed)
Alternate ice and heat to lower back. Avoid bending or twisting movements for 1 week. Follow-up with your primary doctor for recheck if needed.  Return to ER for any worsening symptoms

## 2018-06-23 ENCOUNTER — Ambulatory Visit (HOSPITAL_COMMUNITY)
Admission: RE | Admit: 2018-06-23 | Discharge: 2018-06-23 | Disposition: A | Discharge status: Home / Self Care | Payer: BLUE CROSS/BLUE SHIELD | Source: Ambulatory Visit | Attending: Internal Medicine | Admitting: Internal Medicine

## 2018-06-23 ENCOUNTER — Other Ambulatory Visit (HOSPITAL_COMMUNITY): Payer: Self-pay | Admitting: Internal Medicine

## 2018-06-23 ENCOUNTER — Encounter (HOSPITAL_COMMUNITY): Payer: Self-pay

## 2018-06-23 DIAGNOSIS — M25571 Pain in right ankle and joints of right foot: Secondary | ICD-10-CM | POA: Insufficient documentation

## 2018-06-23 DIAGNOSIS — R52 Pain, unspecified: Secondary | ICD-10-CM

## 2018-07-11 ENCOUNTER — Other Ambulatory Visit (HOSPITAL_COMMUNITY): Payer: Self-pay | Admitting: Family Medicine

## 2018-07-11 DIAGNOSIS — Z1231 Encounter for screening mammogram for malignant neoplasm of breast: Secondary | ICD-10-CM

## 2018-07-23 ENCOUNTER — Ambulatory Visit (HOSPITAL_COMMUNITY)
Admission: RE | Admit: 2018-07-23 | Discharge: 2018-07-23 | Disposition: A | Discharge status: Home / Self Care | Payer: BLUE CROSS/BLUE SHIELD | Source: Ambulatory Visit | Attending: Family Medicine | Admitting: Family Medicine

## 2018-07-23 DIAGNOSIS — Z1231 Encounter for screening mammogram for malignant neoplasm of breast: Secondary | ICD-10-CM | POA: Diagnosis not present

## 2019-07-23 ENCOUNTER — Other Ambulatory Visit (HOSPITAL_COMMUNITY): Payer: Self-pay | Admitting: Family Medicine

## 2019-07-23 DIAGNOSIS — Z1231 Encounter for screening mammogram for malignant neoplasm of breast: Secondary | ICD-10-CM

## 2019-08-05 ENCOUNTER — Other Ambulatory Visit: Payer: Self-pay

## 2019-08-05 ENCOUNTER — Ambulatory Visit (HOSPITAL_COMMUNITY)
Admission: RE | Admit: 2019-08-05 | Discharge: 2019-08-05 | Disposition: A | Discharge status: Home / Self Care | Payer: BLUE CROSS/BLUE SHIELD | Source: Ambulatory Visit | Attending: Family Medicine | Admitting: Family Medicine

## 2019-08-05 DIAGNOSIS — Z1231 Encounter for screening mammogram for malignant neoplasm of breast: Secondary | ICD-10-CM

## 2020-08-17 ENCOUNTER — Other Ambulatory Visit (HOSPITAL_COMMUNITY): Payer: Self-pay | Admitting: Family Medicine

## 2020-08-17 DIAGNOSIS — Z1231 Encounter for screening mammogram for malignant neoplasm of breast: Secondary | ICD-10-CM

## 2020-08-31 ENCOUNTER — Ambulatory Visit (HOSPITAL_COMMUNITY)
Admission: RE | Admit: 2020-08-31 | Discharge: 2020-08-31 | Disposition: A | Discharge status: Home / Self Care | Payer: 59 | Source: Ambulatory Visit | Attending: Family Medicine | Admitting: Family Medicine

## 2020-08-31 ENCOUNTER — Other Ambulatory Visit: Payer: Self-pay

## 2020-08-31 DIAGNOSIS — Z1231 Encounter for screening mammogram for malignant neoplasm of breast: Secondary | ICD-10-CM | POA: Diagnosis present

## 2020-09-18 ENCOUNTER — Ambulatory Visit: Payer: 59

## 2020-09-18 ENCOUNTER — Other Ambulatory Visit: Payer: Self-pay

## 2020-09-18 ENCOUNTER — Encounter: Payer: Self-pay | Admitting: Orthopedic Surgery

## 2020-09-18 ENCOUNTER — Ambulatory Visit (INDEPENDENT_AMBULATORY_CARE_PROVIDER_SITE_OTHER): Payer: 59 | Admitting: Orthopedic Surgery

## 2020-09-18 VITALS — BP 154/97 | HR 85 | Ht 66.0 in | Wt 338.0 lb

## 2020-09-18 DIAGNOSIS — M25561 Pain in right knee: Secondary | ICD-10-CM | POA: Diagnosis not present

## 2020-09-18 DIAGNOSIS — M1711 Unilateral primary osteoarthritis, right knee: Secondary | ICD-10-CM

## 2020-09-18 DIAGNOSIS — Z6841 Body Mass Index (BMI) 40.0 and over, adult: Secondary | ICD-10-CM

## 2020-09-18 DIAGNOSIS — G8929 Other chronic pain: Secondary | ICD-10-CM

## 2020-09-18 NOTE — Patient Instructions (Signed)

## 2020-09-18 NOTE — Progress Notes (Signed)
New Patient Visit  Assessment: Renee Vargas is a 59 y.o. female with the following: 1.  Moderate to severe right knee osteoarthritis  Plan: I had extensive discussion with Ms. Fuqua in clinic today in regards to her right knee.  I reviewed the radiographs which demonstrates moderate to severe osteoarthritis, with joint space narrowing.  She should continue taking medications as needed.  I also provided her with a referral for physical therapy.  We discussed possibility proceeding with a steroid injection, and she is elected to proceed.  Please refer to the procedure note listed below.  I also stressed the importance of her continue to work on weight loss, as she may be a candidate for joint replacement in the future.  However given her current BMI, she would be a very high risk candidate joint replacement, and her goal should be getting her BMI closer to 40.  The patient meets the AMA guidelines for Morbid obesity with BMI > 40.  The patient has been counseled on weight loss.    Procedure note injection Right knee joint   Verbal consent was obtained to inject the right knee joint  Timeout was completed to confirm the site of injection.  The skin was prepped with alcohol and ethyl chloride was sprayed at the injection site.  A 21-gauge needle was used to inject 40 mg of Depo-Medrol and 1% lidocaine (3 cc) into the right knee using an anterolateral approach.  There were no complications. A sterile bandage was applied.   Follow-up: Return if symptoms worsen or fail to improve.  Subjective:  Chief Complaint  Patient presents with  . Knee Pain    right knee pain, since 07/19/20. no falls or injuries,     History of Present Illness: Renee Vargas is a 59 y.o. female who has been referred to clinic today by Delman Cheadle, PA-C for right knee pain.  Ms. Chesnut started to have knee pain approximately 2 months ago.  She denies a specific injury or event.  Prior to the onset of this  pain, she has never had pain in this knee.  No injuries in the past.  She is not done physical therapy recently.  She has never had an injection in her knee.  She does take meloxicam once a day, which is improving her overall symptoms.  She notes small amount of swelling in her knee, but this is improved.  Review of Systems: No fevers or chills No numbness or tingling No chest pain No shortness of breath.  Medical History:  Past Medical History:  Diagnosis Date  . Anemia   . Anxiety   . History of gout   . Hypertension     Past Surgical History:  Procedure Laterality Date  . ABDOMINAL HYSTERECTOMY N/A 03/12/2014   Procedure: SUPRACERVICAL HYSTERECTOMY;  Surgeon: Jonnie Kind, MD;  Location: [facility];  Service: Gynecology;  Laterality: N/A;  . COLONOSCOPY N/A 07/13/2017   Procedure: COLONOSCOPY;  Surgeon: Danie Binder, MD;  Location: [facility];  Service: Endoscopy;  Laterality: N/A;  . fatty mass removed Left    hip  . SALPINGOOPHORECTOMY Bilateral 03/12/2014   Procedure: BILATERAL SALPINGO OOPHORECTOMY;  Surgeon: Jonnie Kind, MD;  Location: [facility];  Service: Gynecology;  Laterality: Bilateral;  . TUBAL LIGATION      Family History  Problem Relation Age of Onset  . Hypertension Mother   . Hypertension Sister    Social History   Tobacco Use  . Smoking status:  Never Smoker  . Smokeless tobacco: Never Used  Substance Use Topics  . Alcohol use: No  . Drug use: No    Allergies  Allergen Reactions  . Aleve [Naproxen Sodium] Hives    Current Meds  Medication Sig  . amLODipine (NORVASC) 10 MG tablet Take 1 tablet by mouth daily.  Marland Kitchen atorvastatin (LIPITOR) 10 MG tablet Take 1 tablet by mouth daily.  Marland Kitchen buPROPion (WELLBUTRIN XL) 150 MG 24 hr tablet Take 150 mg by mouth daily.  Marland Kitchen escitalopram (LEXAPRO) 10 MG tablet Take 10 mg by mouth every morning.  Marland Kitchen estradiol (ESTRACE) 2 MG tablet Take 1 tablet (2 mg total) by mouth daily.  . hydrochlorothiazide (HYDRODIURIL)  25 MG tablet Take 25 mg by mouth daily.  . meloxicam (MOBIC) 7.5 MG tablet Take 1 tablet by mouth daily as needed for severe pain.    Objective: BP (!) 154/97   Pulse 85   Ht 5\' 6"  (1.676 m)   Wt (!) 338 lb (153.3 kg)   LMP 02/21/2014   BMI 54.55 kg/m   Physical Exam:  General: Obese female, no acute distress.  Alert and oriented. Gait: Right-sided antalgic gait  Evaluation the right knee demonstrates no obvious effusion.  She has diffuse tenderness throughout the knee.  Range of motion is excellent from 0 to 120 degrees with minimal discomfort.  Negative Lachman.  No increased instability to varus or valgus stress.  Evaluation left knee demonstrates no effusion.  No tenderness to palpation throughout the knee.  Range of motion is full without pain to 120 degrees of flexion.  Negative Lachman.  No increased instability to varus or valgus stress    IMAGING: I personally ordered and reviewed the following images:  XR right knee degenerative changes throughout the knee including several osteophytes.  Almost complete joint space loss within the medial compartment.  New Medications:  No orders of the defined types were placed in this encounter.     Mordecai Rasmussen, MD  09/18/2020 11:42 AM

## 2020-09-24 ENCOUNTER — Encounter (HOSPITAL_COMMUNITY): Payer: Self-pay | Admitting: Physical Therapy

## 2020-09-24 ENCOUNTER — Other Ambulatory Visit: Payer: Self-pay

## 2020-09-24 ENCOUNTER — Ambulatory Visit (HOSPITAL_COMMUNITY): Payer: 59 | Attending: Orthopedic Surgery | Admitting: Physical Therapy

## 2020-09-24 DIAGNOSIS — M25561 Pain in right knee: Secondary | ICD-10-CM | POA: Insufficient documentation

## 2020-09-24 DIAGNOSIS — R2689 Other abnormalities of gait and mobility: Secondary | ICD-10-CM | POA: Insufficient documentation

## 2020-09-24 NOTE — Patient Instructions (Signed)
Access Code: [email or web address] URL: [email or web address] Date: 09/24/2020 Prepared by: Josue Hector  Exercises Supine Quad Set - 2-3 x daily - 7 x weekly - 2 sets - 10 reps - 5 sec hold Active Straight Leg Raise with Quad Set - 2-3 x daily - 7 x weekly - 2 sets - 10 reps Supine Gluteal Sets - 2-3 x daily - 7 x weekly - 2 sets - 10 reps - 5 sec hold Supine Heel Slide - 2-3 x daily - 7 x weekly - 2 sets - 10 reps - 5 sec hold

## 2020-09-24 NOTE — Therapy (Signed)
[employer] [place], [place], [address] Phone: [phone]   Fax:  [phone]  Physical Therapy Evaluation  Patient Details  Name: Renee Vargas MRN: [id number] Date of Birth: [birth year] Referring Provider (PT): Larena Glassman MD    Encounter Date: 09/24/2020   PT End of Session - 09/24/20 1114    Visit Number 1    Number of Visits 8    Date for PT Re-Evaluation 10/23/20    Authorization Type [employer] ( no auth req, 30 VL)    Authorization - Visit Number 1    Authorization - Number of Visits 30    PT Start Time 1034    PT Stop Time 1115    PT Time Calculation (min) 41 min    Activity Tolerance Patient tolerated treatment well    Behavior During Therapy [facility] for tasks assessed/performed           Past Medical History:  Diagnosis Date  . Anemia   . Anxiety   . History of gout   . Hypertension     Past Surgical History:  Procedure Laterality Date  . ABDOMINAL HYSTERECTOMY N/A 03/12/2014   Procedure: SUPRACERVICAL HYSTERECTOMY;  Surgeon: Jonnie Kind, MD;  Location: [facility];  Service: Gynecology;  Laterality: N/A;  . COLONOSCOPY N/A 07/13/2017   Procedure: COLONOSCOPY;  Surgeon: Danie Binder, MD;  [facility];  Service: Endoscopy;  Laterality: N/A;  . fatty mass removed Left    hip  . SALPINGOOPHORECTOMY Bilateral 03/12/2014   Procedure: BILATERAL SALPINGO OOPHORECTOMY;  Surgeon: Jonnie Kind, MD;  Location: [facility];  Service: Gynecology;  Laterality: Bilateral;  . TUBAL LIGATION      There were no vitals filed for this visit.    Subjective Assessment - 09/24/20 1043    Subjective Patient presents to physical therapy with complaint of RT knee pain. Patetin says pain started with having to get up into [place] for work. This has been going on for about 2 months. Says she has been dealing with the pain. Says it is hard to get comfortable at night. Had recent xray imaging which showed degeneration  and decreasing joint space. Patient has had recent steroid injection about a week ago which she says has helped with the pain so far. Had a shot a few weeks ago that didn't really help for too long. Currently taking Meloxicam for pain and inflammation.    Limitations Lifting;Standing;Walking;House hold activities    Diagnostic tests Xrays    Patient Stated Goals Learn how to deal with pain, learn exercise to move better    Currently in Pain? Yes    Pain Score 1     Pain Location Knee    Pain Orientation Right    Pain Descriptors / Indicators Aching;Tightness;Throbbing    Pain Type Acute pain    Pain Onset More than a month ago    Pain Frequency Intermittent    Aggravating Factors  steps, standing, walking    Pain Relieving Factors injections, meds, rest, non WB    Effect of Pain on Daily Activities Limits              [facility] PT Assessment - 09/24/20 0001      Assessment   Medical Diagnosis RT knee pain/ arthritis     Referring Provider (PT) Larena Glassman MD     Onset Date/Surgical Date --   07/19/20   Prior Therapy Yes for ankle  Precautions   Precautions None      Restrictions   Weight Bearing Restrictions No      Balance Screen   Has the patient fallen in the past 6 months No      [place] residence    Living Arrangements Children      Prior Function   Level of Independence Independent    Vocation Full time employment    Vocation Requirements Mental health counselor       Cognition   Overall Cognitive Status Within Functional Limits for tasks assessed      Observation/Other Assessments   Focus on Therapeutic Outcomes (FOTO)  41% limited       ROM / Strength   AROM / PROM / Strength AROM;Strength      AROM   AROM Assessment Site Knee    Right/Left Knee Left;Right    Right Knee Extension 0    Right Knee Flexion 108    Left Knee Extension 0    Left Knee Flexion 118      Strength   Strength Assessment Site Hip;Knee     Right/Left Hip Right;Left    Right Hip Flexion 4/5    Right Hip Extension 4/5    Right Hip ABduction 3+/5    Left Hip Flexion 4+/5    Left Hip Extension 4/5    Left Hip ABduction 4/5    Right/Left Knee Right;Left    Right Knee Extension 4/5    Left Knee Extension 5/5      Transfers   Five time sit to stand comments  13 sec with no UE       Ambulation/Gait   Ambulation/Gait Yes    Ambulation/Gait Assistance 7: Independent    Assistive device None    Gait Pattern Decreased step length - left;Decreased stance time - right;Decreased stride length;Decreased hip/knee flexion - right;Antalgic    Ambulation Surface Level;Indoor                      Objective measurements completed on examination: See above findings.       [facility] Adult PT Treatment/Exercise - 09/24/20 0001      Exercises   Exercises Knee/Hip      Knee/Hip Exercises: Supine   Quad Sets Right;5 reps    Heel Slides Right;5 reps    Straight Leg Raises Right;5 reps    Other Supine Knee/Hip Exercises glute set 5 x 5"                   PT Education - 09/24/20 1045    Education Details on evaluation findings, POC and HEP    Person(s) Educated Patient    Methods Explanation;Handout    Comprehension Verbalized understanding            PT Short Term Goals - 09/24/20 1318      PT SHORT TERM GOAL #1   Title Patient will be independent with initial HEP and self-management strategies to improve functional outcomes    Time 2    Period Weeks    Status New    Target Date 10/09/20             PT Long Term Goals - 09/24/20 1318      PT LONG TERM GOAL #1   Title Patient will report at least 75% overall improvement in subjective complaint to indicate improvement in ability to perform ADLs.    Time 4  Period Weeks    Status New    Target Date 10/23/20      PT LONG TERM GOAL #2   Title Patient will improve FOTO score by 15% to indicate improvement in functional outcomes    Time 4     Period Weeks    Status New    Target Date 10/23/20      PT LONG TERM GOAL #3   Title Patient will have equal to or > 4/5 MMT throughout RLE to improve ability to perform functional mobility, stair ambulation and ADLs.    Time 4    Period Weeks    Status New    Target Date 10/23/20      PT LONG TERM GOAL #4   Title Patient will have RT knee AROM 0-120 degrees to improve functional mobility and facilitate squatting to pick up items from floor.    Time 4    Period Weeks    Status New    Target Date 10/23/20                  Plan - 09/24/20 1314    Clinical Impression Statement Patient is a 59 y.o. female who presents to physical therapy with complaint of RT knee pain. Patient demonstrates decreased strength, ROM restriction, and gait abnormalities which are likely contributing to symptoms of pain and are negatively impacting patient ability to perform ADLs and functional mobility tasks. Patient will benefit from skilled physical therapy services to address these deficits to reduce pain and improve level of function with ADLs and functional mobility tasks.    Examination-Activity Limitations Stairs;Squat;Transfers;Locomotion Level;Stand    Examination-Participation Restrictions Occupation;Community Activity;Yard Work;Cleaning;Laundry    Stability/Clinical Decision Making Stable/Uncomplicated    Clinical Decision Making Low    Rehab Potential Good    PT Frequency 2x / week    PT Duration 4 weeks    PT Treatment/Interventions ADLs/Self Care Home Management;Aquatic Therapy;Biofeedback;Cryotherapy;Electrical Stimulation;Contrast Bath;Therapeutic exercise;Orthotic Fit/Training;Compression bandaging;Vasopneumatic Device;Joint Manipulations;Spinal Manipulations;Energy conservation;Splinting;Taping;Manual lymph drainage;Manual techniques;Patient/family education;Functional mobility training;Therapeutic activities;Parrafin;Fluidtherapy;Ultrasound;Traction;Moist Heat;Stair  training;Neuromuscular re-education;Gait training;Iontophoresis 4mg /ml Dexamethasone;DME Instruction;Balance training;Scar mobilization;Passive range of motion;Dry needling    PT Next Visit Plan Review goals and HEP. Progress hip and knee strength and mobility as tolerated. Manuals PRN for pain and restriction    PT Home Exercise Plan 09/24/20: quad set, glute set, heel slide, SLR    Consulted and Agree with Plan of Care Patient           Patient will benefit from skilled therapeutic intervention in order to improve the following deficits and impairments:  Abnormal gait, Pain, Improper body mechanics, Decreased activity tolerance, Decreased range of motion, Decreased strength, Hypomobility, Difficulty walking, Decreased mobility  Visit Diagnosis: Other abnormalities of gait and mobility  Acute pain of right knee     Problem List Patient Active Problem List   Diagnosis Date Noted  . Special screening for malignant neoplasms, colon   . Postoperative state 03/26/2014  . Status post abdominal supracervical subtotal hysterectomy 03/12/2014  . Fibroids, intramural 02/28/2014  . Menorrhagia 02/28/2014  . Fibroids 02/19/2014  . Anemia 02/19/2014  . Heavy menstrual bleeding 02/19/2014  . Fibroid uterus 11/07/2013   1:22 PM, 09/24/20 Josue Hector PT DPT  Physical Therapist with [facility]  [phone] [place] [address] [place], [place], [address] Phone: [phone]   Fax:  [phone]  Name: ETHELDA DEANGELO MRN: [id number] Date of Birth: Dec 10, 1961

## 2020-10-05 ENCOUNTER — Telehealth (HOSPITAL_COMMUNITY): Payer: Self-pay | Admitting: Physical Therapy

## 2020-10-05 ENCOUNTER — Ambulatory Visit (HOSPITAL_COMMUNITY): Payer: 59 | Admitting: Physical Therapy

## 2020-10-05 NOTE — Telephone Encounter (Signed)
pt called to cx today's appt due to she had a family emergency

## 2020-10-07 ENCOUNTER — Ambulatory Visit (HOSPITAL_COMMUNITY): Payer: 59 | Admitting: Physical Therapy

## 2020-10-13 ENCOUNTER — Ambulatory Visit (HOSPITAL_COMMUNITY): Payer: 59 | Admitting: Physical Therapy

## 2020-10-13 ENCOUNTER — Other Ambulatory Visit: Payer: Self-pay

## 2020-10-13 ENCOUNTER — Encounter (HOSPITAL_COMMUNITY): Payer: Self-pay | Admitting: Physical Therapy

## 2020-10-13 DIAGNOSIS — M25561 Pain in right knee: Secondary | ICD-10-CM

## 2020-10-13 DIAGNOSIS — R2689 Other abnormalities of gait and mobility: Secondary | ICD-10-CM

## 2020-10-13 NOTE — Therapy (Signed)
[place], [place], [address] Phone: [phone]   Fax:  [phone]  Physical Therapy Treatment  Patient Details  Name: Renee Vargas MRN: [id number] Date of Birth: 04/01/1961 Referring Provider (PT): Larena Glassman MD    Encounter Date: 10/13/2020   PT End of Session - 10/13/20 0828    Visit Number 2    Number of Visits 8    Date for PT Re-Evaluation 10/23/20    Authorization Type [employer] ( no auth req, 30 VL)    Authorization - Visit Number 2    Authorization - Number of Visits 30    PT Start Time 0820    PT Stop Time 0900    PT Time Calculation (min) 40 min    Activity Tolerance Patient tolerated treatment well    Behavior During Therapy [facility] for tasks assessed/performed           Past Medical History:  Diagnosis Date  . Anemia   . Anxiety   . History of gout   . Hypertension     Past Surgical History:  Procedure Laterality Date  . ABDOMINAL HYSTERECTOMY N/A 03/12/2014   Procedure: SUPRACERVICAL HYSTERECTOMY;  Surgeon: Jonnie Kind, MD;  Location: [facility];  Service: Gynecology;  Laterality: N/A;  . COLONOSCOPY N/A 07/13/2017   Procedure: COLONOSCOPY;  Surgeon: Danie Binder, MD;  Location: [facility];  Service: Endoscopy;  Laterality: N/A;  . fatty mass removed Left    hip  . SALPINGOOPHORECTOMY Bilateral 03/12/2014   Procedure: BILATERAL SALPINGO OOPHORECTOMY;  Surgeon: Jonnie Kind, MD;  Location: [facility];  Service: Gynecology;  Laterality: Bilateral;  . TUBAL LIGATION      There were no vitals filed for this visit.   Subjective Assessment - 10/13/20 0826    Subjective Patient says she has had family issues last week and was not able to do HEP. Patient says she started HEP this week and was very sore last night. Says she knows she has been "overdoing it" with everything going on. Says knee was throbbing last night.    Limitations Lifting;Standing;Walking;House hold activities     Diagnostic tests Xrays    Patient Stated Goals Learn how to deal with pain, learn exercise to move better    Currently in Pain? Yes    Pain Score 9     Pain Location Knee    Pain Orientation Right    Pain Descriptors / Indicators Tightness;Throbbing    Pain Type Acute pain    Pain Onset More than a month ago    Pain Frequency Constant    Aggravating Factors  WB    Pain Relieving Factors non WB, rest    Effect of Pain on Daily Activities Limits                             [facility] Adult PT Treatment/Exercise - 10/13/20 0001      Knee/Hip Exercises: Stretches   Active Hamstring Stretch Right;5 reps;10 seconds      Knee/Hip Exercises: Supine   Quad Sets 10 reps;Right    Quad Sets Limitations 5" hold     Heel Slides Right;10 reps    Heel Slides Limitations 5" hold     Bridges 10 reps    Bridges Limitations 5" hold     Straight Leg Raises Right;10 reps    Other Supine Knee/Hip Exercises glute set 10 x 5"  Manual Therapy   Manual Therapy Joint mobilization;Soft tissue mobilization    Manual therapy comments Manuals performed separate from all other activity     Joint Mobilization RT knee patella mobs grade II for pain in all planes to tolerance     Soft tissue mobilization STM to RT distal quad and adductors and distal ITB for pain and restriction                     PT Short Term Goals - 09/24/20 1318      PT SHORT TERM GOAL #1   Title Patient will be independent with initial HEP and self-management strategies to improve functional outcomes    Time 2    Period Weeks    Status New    Target Date 10/09/20             PT Long Term Goals - 09/24/20 1318      PT LONG TERM GOAL #1   Title Patient will report at least 75% overall improvement in subjective complaint to indicate improvement in ability to perform ADLs.    Time 4    Period Weeks    Status New    Target Date 10/23/20      PT LONG TERM GOAL #2   Title Patient will improve  FOTO score by 15% to indicate improvement in functional outcomes    Time 4    Period Weeks    Status New    Target Date 10/23/20      PT LONG TERM GOAL #3   Title Patient will have equal to or > 4/5 MMT throughout RLE to improve ability to perform functional mobility, stair ambulation and ADLs.    Time 4    Period Weeks    Status New    Target Date 10/23/20      PT LONG TERM GOAL #4   Title Patient will have RT knee AROM 0-120 degrees to improve functional mobility and facilitate squatting to pick up items from floor.    Time 4    Period Weeks    Status New    Target Date 10/23/20                 Plan - 10/13/20 0901    Clinical Impression Statement Patient tolerated session well overall today. Activity graded per patient tolerance. Reviewed therapy goals and HEP. Progressed table strengthening exercise with focus on performing exercise in comfortable range. Patient educated on proper form and function of all added activity. Added manual STM and joint mobs to address knee pain. Patient with notable TTP about lateral aspect of RT knee joint, around distal ITB. Held HEP progressions today per patient tolerance, will add next visit if able.    Examination-Activity Limitations Stairs;Squat;Transfers;Locomotion Level;Stand    Examination-Participation Restrictions Occupation;Community Activity;Yard Work;Cleaning;Laundry    Stability/Clinical Decision Making Stable/Uncomplicated    Rehab Potential Good    PT Frequency 2x / week    PT Duration 4 weeks    PT Treatment/Interventions ADLs/Self Care Home Management;Aquatic Therapy;Biofeedback;Cryotherapy;Electrical Stimulation;Contrast Bath;Therapeutic exercise;Orthotic Fit/Training;Compression bandaging;Vasopneumatic Device;Joint Manipulations;Spinal Manipulations;Energy conservation;Splinting;Taping;Manual lymph drainage;Manual techniques;Patient/family education;Functional mobility training;Therapeutic  activities;Parrafin;Fluidtherapy;Ultrasound;Traction;Moist Heat;Stair training;Neuromuscular re-education;Gait training;Iontophoresis 4mg /ml Dexamethasone;DME Instruction;Balance training;Scar mobilization;Passive range of motion;Dry needling    PT Next Visit Plan Continue manual as needed. Progress table strengthening as tolerated. Progress to standing when ready. Consider adding table hip abduction or clamshells next visit.    PT Home Exercise Plan 09/24/20: quad set, glute set, heel slide, SLR  Consulted and Agree with Plan of Care Patient           Patient will benefit from skilled therapeutic intervention in order to improve the following deficits and impairments:  Abnormal gait, Pain, Improper body mechanics, Decreased activity tolerance, Decreased range of motion, Decreased strength, Hypomobility, Difficulty walking, Decreased mobility  Visit Diagnosis: Other abnormalities of gait and mobility  Acute pain of right knee     Problem List Patient Active Problem List   Diagnosis Date Noted  . Special screening for malignant neoplasms, colon   . Postoperative state 03/26/2014  . Status post abdominal supracervical subtotal hysterectomy 03/12/2014  . Fibroids, intramural 02/28/2014  . Menorrhagia 02/28/2014  . Fibroids 02/19/2014  . Anemia 02/19/2014  . Heavy menstrual bleeding 02/19/2014  . Fibroid uterus 11/07/2013   9:03 AM, 10/13/20 Josue Hector PT DPT  Physical Therapist with [facility]  [phone] [place] [address] [place], [place], [address] Phone: [phone]   Fax:  [phone]  Name: MARKITA STCHARLES MRN: [id number] Date of Birth: [birth year]

## 2020-10-15 ENCOUNTER — Other Ambulatory Visit: Payer: Self-pay

## 2020-10-15 ENCOUNTER — Ambulatory Visit (HOSPITAL_COMMUNITY): Payer: 59 | Admitting: Physical Therapy

## 2020-10-15 ENCOUNTER — Encounter (HOSPITAL_COMMUNITY): Payer: Self-pay | Admitting: Physical Therapy

## 2020-10-15 DIAGNOSIS — M25561 Pain in right knee: Secondary | ICD-10-CM

## 2020-10-15 DIAGNOSIS — R2689 Other abnormalities of gait and mobility: Secondary | ICD-10-CM | POA: Diagnosis not present

## 2020-10-15 NOTE — Patient Instructions (Signed)
Access Code: [email or web address] URL: [email or web address] Date: 10/15/2020 Prepared by: Josue Hector  Exercises Supine Bridge - 2-3 x daily - 7 x weekly - 2 sets - 10 reps - 5 second hold Clamshell - 2-3 x daily - 7 x weekly - 2 sets - 10 reps

## 2020-10-15 NOTE — Therapy (Signed)
[place], [place], [address] Phone: [phone]   Fax:  [phone]  Physical Therapy Treatment  Patient Details  Name: Renee Vargas MRN: [id number] Date of Birth: 1961/05/06 Referring Provider (PT): Larena Glassman MD    Encounter Date: 10/15/2020   PT End of Session - 10/15/20 0828    Visit Number 3    Number of Visits 8    Date for PT Re-Evaluation 10/23/20    Authorization Type [employer] ( no auth req, 30 VL)    Authorization - Visit Number 3    Authorization - Number of Visits 30    PT Start Time [phone]    PT Stop Time 0903    PT Time Calculation (min) 40 min    Activity Tolerance Patient tolerated treatment well    Behavior During Therapy [facility] for tasks assessed/performed           Past Medical History:  Diagnosis Date  . Anemia   . Anxiety   . History of gout   . Hypertension     Past Surgical History:  Procedure Laterality Date  . ABDOMINAL HYSTERECTOMY N/A 03/12/2014   Procedure: SUPRACERVICAL HYSTERECTOMY;  Surgeon: Jonnie Kind, MD;  Location: [facility];  Service: Gynecology;  Laterality: N/A;  . COLONOSCOPY N/A 07/13/2017   Procedure: COLONOSCOPY;  Surgeon: Danie Binder, MD;  [facility];  Service: Endoscopy;  Laterality: N/A;  . fatty mass removed Left    hip  . SALPINGOOPHORECTOMY Bilateral 03/12/2014   Procedure: BILATERAL SALPINGO OOPHORECTOMY;  Surgeon: Jonnie Kind, MD;  Location: [facility];  Service: Gynecology;  Laterality: Bilateral;  . TUBAL LIGATION      There were no vitals filed for this visit.   Subjective Assessment - 10/15/20 0827    Subjective Patient says she felt really good after last visit. Says up until last night, she was doing her heel slides and knee was really tight. Says she was able to sleep better last night and reports no pain this morning.    Limitations Lifting;Standing;Walking;House hold activities    Diagnostic tests Xrays    Patient  Stated Goals Learn how to deal with pain, learn exercise to move better    Currently in Pain? No/denies    Pain Onset More than a month ago                             [facility] Adult PT Treatment/Exercise - 10/15/20 0001      Knee/Hip Exercises: Standing   Heel Raises Both;2 sets;10 reps      Knee/Hip Exercises: Seated   Sit to Sand 2 sets;10 reps;without UE support      Knee/Hip Exercises: Supine   Quad Sets Right;15 reps    Quad Sets Limitations 5" hold     Heel Slides Right;15 reps    Heel Slides Limitations 5" hold     Bridges Both;15 reps    Bridges Limitations 5" hold     Straight Leg Raises Right;15 reps      Knee/Hip Exercises: Sidelying   Clams 15x each       Manual Therapy   Manual Therapy Joint mobilization;Soft tissue mobilization    Manual therapy comments Manuals performed separate from all other activity     Joint Mobilization RT knee patella mobs grade II for pain in all planes to tolerance     Soft tissue mobilization  STM to RT distal quad and adductors and distal ITB for pain and restriction                     PT Short Term Goals - 09/24/20 1318      PT SHORT TERM GOAL #1   Title Patient will be independent with initial HEP and self-management strategies to improve functional outcomes    Time 2    Period Weeks    Status New    Target Date 10/09/20             PT Long Term Goals - 09/24/20 1318      PT LONG TERM GOAL #1   Title Patient will report at least 75% overall improvement in subjective complaint to indicate improvement in ability to perform ADLs.    Time 4    Period Weeks    Status New    Target Date 10/23/20      PT LONG TERM GOAL #2   Title Patient will improve FOTO score by 15% to indicate improvement in functional outcomes    Time 4    Period Weeks    Status New    Target Date 10/23/20      PT LONG TERM GOAL #3   Title Patient will have equal to or > 4/5 MMT throughout RLE to improve ability to  perform functional mobility, stair ambulation and ADLs.    Time 4    Period Weeks    Status New    Target Date 10/23/20      PT LONG TERM GOAL #4   Title Patient will have RT knee AROM 0-120 degrees to improve functional mobility and facilitate squatting to pick up items from floor.    Time 4    Period Weeks    Status New    Target Date 10/23/20                 Plan - 10/15/20 0900    Clinical Impression Statement Patient tolerated session well today with no increased complaint of pain. Patient showed overall improved tolerance to activity today. Able to progress to standing knee strengthening exercise, patient tolerated this well. Patient educated on proper form and function of all added exercise. Patient cued on hold times with hip bridging as well as proper foot placement. Patient educated on and issued updated HEP handout.    Examination-Activity Limitations Stairs;Squat;Transfers;Locomotion Level;Stand    Examination-Participation Restrictions Occupation;Community Activity;Yard Work;Cleaning;Laundry    Stability/Clinical Decision Making Stable/Uncomplicated    Rehab Potential Good    PT Frequency 2x / week    PT Duration 4 weeks    PT Treatment/Interventions ADLs/Self Care Home Management;Aquatic Therapy;Biofeedback;Cryotherapy;Electrical Stimulation;Contrast Bath;Therapeutic exercise;Orthotic Fit/Training;Compression bandaging;Vasopneumatic Device;Joint Manipulations;Spinal Manipulations;Energy conservation;Splinting;Taping;Manual lymph drainage;Manual techniques;Patient/family education;Functional mobility training;Therapeutic activities;Parrafin;Fluidtherapy;Ultrasound;Traction;Moist Heat;Stair training;Neuromuscular re-education;Gait training;Iontophoresis 4mg /ml Dexamethasone;DME Instruction;Balance training;Scar mobilization;Passive range of motion;Dry needling    PT Next Visit Plan Continue manual as needed. Progress standing strengthening exercises as tolerated. Add step  ups next visit    PT Home Exercise Plan 09/24/20: quad set, glute set, heel slide, SLR 10/15/20: bridge, clamshell    Consulted and Agree with Plan of Care Patient           Patient will benefit from skilled therapeutic intervention in order to improve the following deficits and impairments:  Abnormal gait, Pain, Improper body mechanics, Decreased activity tolerance, Decreased range of motion, Decreased strength, Hypomobility, Difficulty walking, Decreased mobility  Visit Diagnosis: Other abnormalities of gait and mobility  Acute pain of right knee     Problem List Patient Active Problem List   Diagnosis Date Noted  . Special screening for malignant neoplasms, colon   . Postoperative state 03/26/2014  . Status post abdominal supracervical subtotal hysterectomy 03/12/2014  . Fibroids, intramural 02/28/2014  . Menorrhagia 02/28/2014  . Fibroids 02/19/2014  . Anemia 02/19/2014  . Heavy menstrual bleeding 02/19/2014  . Fibroid uterus 11/07/2013   9:05 AM, 10/15/20 Josue Hector PT DPT  Physical Therapist with [facility]  [phone] [place] [address] [place], [place], [address] Phone: [phone]   Fax:  [phone]  Name: Renee Vargas MRN: [id number] Date of Birth: 1961/03/15

## 2020-10-19 ENCOUNTER — Other Ambulatory Visit: Payer: Self-pay

## 2020-10-19 ENCOUNTER — Encounter (HOSPITAL_COMMUNITY): Payer: Self-pay | Admitting: Physical Therapy

## 2020-10-19 ENCOUNTER — Ambulatory Visit (HOSPITAL_COMMUNITY): Payer: 59 | Attending: Orthopedic Surgery | Admitting: Physical Therapy

## 2020-10-19 DIAGNOSIS — M25561 Pain in right knee: Secondary | ICD-10-CM | POA: Insufficient documentation

## 2020-10-19 DIAGNOSIS — R2689 Other abnormalities of gait and mobility: Secondary | ICD-10-CM | POA: Insufficient documentation

## 2020-10-19 NOTE — Patient Instructions (Signed)
Access Code: [email or web address] URL: [email or web address] Date: 10/19/2020 Prepared by: Josue Hector  Exercises Sit to Stand without Arm Support - 2 x daily - 7 x weekly - 2 sets - 10 reps Standing Heel Raise with Support - 2 x daily - 7 x weekly - 2 sets - 10 reps Clamshell with Resistance - 2 x daily - 7 x weekly - 2 sets - 10 reps

## 2020-10-19 NOTE — Therapy (Signed)
[place], [place], [address] Phone: [phone]   Fax:  [phone]  Physical Therapy Treatment  Patient Details  Name: Renee Vargas MRN: [id number] Date of Birth: January 15, 1961 Referring Provider (PT): Larena Glassman MD    Encounter Date: 10/19/2020   PT End of Session - 10/19/20 0829    Visit Number 4    Number of Visits 8    Date for PT Re-Evaluation 10/23/20    Authorization Type [employer] ( no auth req, 30 VL)    Authorization - Visit Number 4    Authorization - Number of Visits 30    PT Start Time [phone]    PT Stop Time 0905    PT Time Calculation (min) 42 min    Activity Tolerance Patient tolerated treatment well    Behavior During Therapy [facility] for tasks assessed/performed           Past Medical History:  Diagnosis Date  . Anemia   . Anxiety   . History of gout   . Hypertension     Past Surgical History:  Procedure Laterality Date  . ABDOMINAL HYSTERECTOMY N/A 03/12/2014   Procedure: SUPRACERVICAL HYSTERECTOMY;  Surgeon: Jonnie Kind, MD;  Location: [facility];  Service: Gynecology;  Laterality: N/A;  . COLONOSCOPY N/A 07/13/2017   Procedure: COLONOSCOPY;  Surgeon: Danie Binder, MD;  [facility];  Service: Endoscopy;  Laterality: N/A;  . fatty mass removed Left    hip  . SALPINGOOPHORECTOMY Bilateral 03/12/2014   Procedure: BILATERAL SALPINGO OOPHORECTOMY;  Surgeon: Jonnie Kind, MD;  Location: [facility];  Service: Gynecology;  Laterality: Bilateral;  . TUBAL LIGATION      There were no vitals filed for this visit.   Subjective Assessment - 10/19/20 0829    Subjective Patient says her knee is doing great. No pain reported.    Limitations Lifting;Standing;Walking;House hold activities    Diagnostic tests Xrays    Patient Stated Goals Learn how to deal with pain, learn exercise to move better    Currently in Pain? No/denies    Pain Onset More than a month ago                              [facility] Adult PT Treatment/Exercise - 10/19/20 0001      Knee/Hip Exercises: Standing   Heel Raises Both;2 sets;10 reps    Lateral Step Up Right;Hand Hold: 0;Step Height: 4";1 set    Forward Step Up Right;2 sets;10 reps;Hand Hold: 0;Step Height: 4"    Other Standing Knee Exercises sidestepping with RTB 6 x at table       Knee/Hip Exercises: Seated   Sit to Sand 2 sets;10 reps;without UE support      Knee/Hip Exercises: Supine   Quad Sets Right;15 reps    Quad Sets Limitations 5" hold    Heel Slides Right;15 reps    Heel Slides Limitations 5" hold     Bridges Both;15 reps    Straight Leg Raises Right;15 reps      Knee/Hip Exercises: Sidelying   Clams 15x each       Manual Therapy   Manual Therapy Soft tissue mobilization    Manual therapy comments Manuals performed separate from all other activity     Soft tissue mobilization STM to RT distal quad and adductors and distal ITB for pain and restriction  PT Short Term Goals - 09/24/20 1318      PT SHORT TERM GOAL #1   Title Patient will be independent with initial HEP and self-management strategies to improve functional outcomes    Time 2    Period Weeks    Status New    Target Date 10/09/20             PT Long Term Goals - 09/24/20 1318      PT LONG TERM GOAL #1   Title Patient will report at least 75% overall improvement in subjective complaint to indicate improvement in ability to perform ADLs.    Time 4    Period Weeks    Status New    Target Date 10/23/20      PT LONG TERM GOAL #2   Title Patient will improve FOTO score by 15% to indicate improvement in functional outcomes    Time 4    Period Weeks    Status New    Target Date 10/23/20      PT LONG TERM GOAL #3   Title Patient will have equal to or > 4/5 MMT throughout RLE to improve ability to perform functional mobility, stair ambulation and ADLs.    Time 4    Period Weeks    Status  New    Target Date 10/23/20      PT LONG TERM GOAL #4   Title Patient will have RT knee AROM 0-120 degrees to improve functional mobility and facilitate squatting to pick up items from floor.    Time 4    Period Weeks    Status New    Target Date 10/23/20                 Plan - 10/19/20 0856    Clinical Impression Statement Patient tolerated session well overall today. Patient demos dropped medial arch of RT foot with forward step up on 4 inch box, but was able to correct with verbal cues. Patient noted slight discomfort in RT knee with lateral step ups on 4 inch box. Added band resisted sidestepping for improved lateral strengthening and stability. Patient cued on proper form and educated on purpose and function. Patient educated on and issued updated HEP handout.    Examination-Activity Limitations Stairs;Squat;Transfers;Locomotion Level;Stand    Examination-Participation Restrictions Occupation;Community Activity;Yard Work;Cleaning;Laundry    Stability/Clinical Decision Making Stable/Uncomplicated    Rehab Potential Good    PT Frequency 2x / week    PT Duration 4 weeks    PT Treatment/Interventions ADLs/Self Care Home Management;Aquatic Therapy;Biofeedback;Cryotherapy;Electrical Stimulation;Contrast Bath;Therapeutic exercise;Orthotic Fit/Training;Compression bandaging;Vasopneumatic Device;Joint Manipulations;Spinal Manipulations;Energy conservation;Splinting;Taping;Manual lymph drainage;Manual techniques;Patient/family education;Functional mobility training;Therapeutic activities;Parrafin;Fluidtherapy;Ultrasound;Traction;Moist Heat;Stair training;Neuromuscular re-education;Gait training;Iontophoresis 4mg /ml Dexamethasone;DME Instruction;Balance training;Scar mobilization;Passive range of motion;Dry needling    PT Next Visit Plan Reassess next visit. Adjust POC as indicated    PT Home Exercise Plan 09/24/20: quad set, glute set, heel slide, SLR 10/15/20: bridge, clamshell 10/19/20: sit  to stand, heel raises, red band to clamshells    Consulted and Agree with Plan of Care Patient           Patient will benefit from skilled therapeutic intervention in order to improve the following deficits and impairments:  Abnormal gait, Pain, Improper body mechanics, Decreased activity tolerance, Decreased range of motion, Decreased strength, Hypomobility, Difficulty walking, Decreased mobility  Visit Diagnosis: Other abnormalities of gait and mobility  Acute pain of right knee     Problem List Patient Active Problem List   Diagnosis Date Noted  .  Special screening for malignant neoplasms, colon   . Postoperative state 03/26/2014  . Status post abdominal supracervical subtotal hysterectomy 03/12/2014  . Fibroids, intramural 02/28/2014  . Menorrhagia 02/28/2014  . Fibroids 02/19/2014  . Anemia 02/19/2014  . Heavy menstrual bleeding 02/19/2014  . Fibroid uterus 11/07/2013   9:06 AM, 10/19/20 Josue Hector PT DPT  Physical Therapist with [facility]  [phone] [place] [address] [place], [place], [address] Phone: [phone]   Fax:  [phone]  Name: Renee Vargas MRN: [id number] Date of Birth: May 13, 1961

## 2020-10-21 ENCOUNTER — Encounter (HOSPITAL_COMMUNITY): Payer: Self-pay | Admitting: Physical Therapy

## 2020-10-21 ENCOUNTER — Other Ambulatory Visit: Payer: Self-pay

## 2020-10-21 ENCOUNTER — Ambulatory Visit (HOSPITAL_COMMUNITY): Payer: 59 | Admitting: Physical Therapy

## 2020-10-21 DIAGNOSIS — R2689 Other abnormalities of gait and mobility: Secondary | ICD-10-CM | POA: Diagnosis not present

## 2020-10-21 DIAGNOSIS — M25561 Pain in right knee: Secondary | ICD-10-CM

## 2020-10-21 NOTE — Therapy (Signed)
[place], [place], [address] Phone: [phone]   Fax:  [phone]  Physical Therapy Treatment  Patient Details  Name: Renee Vargas MRN: [id number] Date of Birth: 1961-12-15 Referring Provider (PT): Larena Glassman MD    Encounter Date: 10/21/2020   PT End of Session - 10/21/20 0912    Visit Number 5    Number of Visits 9    Date for PT Re-Evaluation 11/06/20    Authorization Type [employer] ( no auth req, 30 VL)    Authorization - Visit Number 5    Authorization - Number of Visits 30    PT Start Time 0906    PT Stop Time 0945    PT Time Calculation (min) 39 min    Activity Tolerance Patient tolerated treatment well    Behavior During Therapy [facility] for tasks assessed/performed           Past Medical History:  Diagnosis Date  . Anemia   . Anxiety   . History of gout   . Hypertension     Past Surgical History:  Procedure Laterality Date  . ABDOMINAL HYSTERECTOMY N/A 03/12/2014   Procedure: SUPRACERVICAL HYSTERECTOMY;  Surgeon: Jonnie Kind, MD;  Location: [facility];  Service: Gynecology;  Laterality: N/A;  . COLONOSCOPY N/A 07/13/2017   Procedure: COLONOSCOPY;  Surgeon: Danie Binder, MD;  Location: [facility];  Service: Endoscopy;  Laterality: N/A;  . fatty mass removed Left    hip  . SALPINGOOPHORECTOMY Bilateral 03/12/2014   Procedure: BILATERAL SALPINGO OOPHORECTOMY;  Surgeon: Jonnie Kind, MD;  Location: [facility];  Service: Gynecology;  Laterality: Bilateral;  . TUBAL LIGATION      There were no vitals filed for this visit.   Subjective Assessment - 10/21/20 0912    Subjective Patient says her knee has been doing very good. Not sore with progressions last visit. No pain today. Doing very well. Patient reports at least 80% improvement since starting therapy.    Limitations Lifting;Standing;Walking;House hold activities    Diagnostic tests Xrays    Patient Stated Goals Learn how to deal  with pain, learn exercise to move better    Currently in Pain? No/denies    Pain Onset More than a month ago              [facility] PT Assessment - 10/21/20 0001      Assessment   Medical Diagnosis RT knee pain/ arthritis     Referring Provider (PT) Larena Glassman MD     Prior Therapy Yes for ankle      Precautions   Precautions None      Restrictions   Weight Bearing Restrictions No      Balance Screen   Has the patient fallen in the past 6 months No      [place] residence    Living Arrangements Children      Prior Function   Level of Independence Independent    Vocation Full time employment    Vocation Requirements Mental health counselor       Cognition   Overall Cognitive Status Within Functional Limits for tasks assessed      Observation/Other Assessments   Focus on Therapeutic Outcomes (FOTO)  24% limited    was 41% limited      AROM   Right Knee Extension 0    Right Knee Flexion 112   was 108   Left  Knee Extension 0    Left Knee Flexion 116   was 118     Strength   Right Hip Flexion 4+/5   was 4   Right Hip Extension 4+/5   was 4   Right Hip ABduction 4-/5   was 3+   Left Hip Flexion 5/5   was 4+   Left Hip Extension 4+/5   was 4   Left Hip ABduction 4+/5   was 4   Right Knee Extension 4+/5   was 4   Left Knee Extension 5/5                         [facility] Adult PT Treatment/Exercise - 10/21/20 0001      Knee/Hip Exercises: Supine   Quad Sets Right;15 reps    Quad Sets Limitations 5"    Heel Slides Right;15 reps    Heel Slides Limitations 5"    Bridges Both;15 reps    Straight Leg Raises Right;15 reps      Knee/Hip Exercises: Sidelying   Clams 15x each                   PT Education - 10/21/20 0935    Education Details on reassessment findings, progress toward goals and POC    Person(s) Educated Patient    Methods Explanation    Comprehension Verbalized understanding             PT Short Term Goals - 10/21/20 0927      PT SHORT TERM GOAL #1   Title Patient will be independent with initial HEP and self-management strategies to improve functional outcomes    Baseline Reports and demo compliance    Time 2    Period Weeks    Status Achieved    Target Date 10/09/20             PT Long Term Goals - 10/21/20 0928      PT LONG TERM GOAL #1   Title Patient will report at least 75% overall improvement in subjective complaint to indicate improvement in ability to perform ADLs.    Baseline Reports 80% improved    Time 4    Period Weeks    Status Achieved      PT LONG TERM GOAL #2   Title Patient will improve FOTO score by 15% to indicate improvement in functional outcomes    Baseline Improved 17%    Time 4    Period Weeks    Status Achieved      PT LONG TERM GOAL #3   Title Patient will have equal to or > 4/5 MMT throughout RLE to improve ability to perform functional mobility, stair ambulation and ADLs.    Baseline All at least 4/5 except RT hip abduction    Time 4    Period Weeks    Status Partially Met      PT LONG TERM GOAL #4   Title Patient will have RT knee AROM 0-120 degrees to improve functional mobility and facilitate squatting to pick up items from floor.    Baseline RT knee AROM 0-112    Time 4    Period Weeks    Status On-going                 Plan - 10/21/20 0935    Clinical Impression Statement Patient shows good progress toward therapy goals. Patient currently with 4/5 goals met/ partially met. Patient remains limited  by mild RT hip weakness and slight RT knee AROM restriction which continue to negatively impact functional ability. Patient will continue to benefit from skilled therapy services to address remaining deficits to reduce pain, improve LOF with ADLs and functional mobility.    Examination-Activity Limitations Stairs;Squat;Transfers;Locomotion Level;Stand    Examination-Participation Restrictions Occupation;Community  Activity;Yard Work;Cleaning;Laundry    Stability/Clinical Decision Making Stable/Uncomplicated    Rehab Potential Good    PT Frequency 2x / week    PT Duration 2 weeks    PT Treatment/Interventions ADLs/Self Care Home Management;Aquatic Therapy;Biofeedback;Cryotherapy;Electrical Stimulation;Contrast Bath;Therapeutic exercise;Orthotic Fit/Training;Compression bandaging;Vasopneumatic Device;Joint Manipulations;Spinal Manipulations;Energy conservation;Splinting;Taping;Manual lymph drainage;Manual techniques;Patient/family education;Functional mobility training;Therapeutic activities;Parrafin;Fluidtherapy;Ultrasound;Traction;Moist Heat;Stair training;Neuromuscular re-education;Gait training;Iontophoresis 35m/ml Dexamethasone;DME Instruction;Balance training;Scar mobilization;Passive range of motion;Dry needling    PT Next Visit Plan Conitnue to progress RT knee strength and ROM as tolerated. Add TKE, step downs next visit    PT Home Exercise Plan 09/24/20: quad set, glute set, heel slide, SLR 10/15/20: bridge, clamshell 10/19/20: sit to stand, heel raises, red band to clamshells    Consulted and Agree with Plan of Care Patient           Patient will benefit from skilled therapeutic intervention in order to improve the following deficits and impairments:  Abnormal gait, Pain, Improper body mechanics, Decreased activity tolerance, Decreased range of motion, Decreased strength, Hypomobility, Difficulty walking, Decreased mobility  Visit Diagnosis: Other abnormalities of gait and mobility  Acute pain of right knee     Problem List Patient Active Problem List   Diagnosis Date Noted  . Special screening for malignant neoplasms, colon   . Postoperative state 03/26/2014  . Status post abdominal supracervical subtotal hysterectomy 03/12/2014  . Fibroids, intramural 02/28/2014  . Menorrhagia 02/28/2014  . Fibroids 02/19/2014  . Anemia 02/19/2014  . Heavy menstrual bleeding 02/19/2014  . Fibroid  uterus 11/07/2013    9:42 AM, 10/21/20 CJosue HectorPT DPT  [facility] [phone] [address]Phone: [phone]  Fax:  [phone] Name: Renee RUDYMRN: [id number] of Birth: [birth year]

## 2020-10-29 ENCOUNTER — Ambulatory Visit (HOSPITAL_COMMUNITY): Payer: 59 | Admitting: Physical Therapy

## 2020-10-29 ENCOUNTER — Telehealth (HOSPITAL_COMMUNITY): Payer: Self-pay | Admitting: Physical Therapy

## 2020-10-29 NOTE — Telephone Encounter (Signed)
She has to work and will not be here today- she will be here 11/16

## 2020-11-03 ENCOUNTER — Ambulatory Visit (HOSPITAL_COMMUNITY): Payer: 59 | Admitting: Physical Therapy

## 2020-11-03 ENCOUNTER — Telehealth (HOSPITAL_COMMUNITY): Payer: Self-pay | Admitting: Physical Therapy

## 2020-11-03 NOTE — Telephone Encounter (Signed)
pt called to cx both appts due to she has a cold.

## 2020-11-04 ENCOUNTER — Ambulatory Visit (HOSPITAL_COMMUNITY): Payer: 59 | Admitting: Physical Therapy

## 2020-12-14 ENCOUNTER — Ambulatory Visit: Payer: Self-pay

## 2020-12-15 ENCOUNTER — Other Ambulatory Visit: Payer: 59

## 2021-02-20 ENCOUNTER — Other Ambulatory Visit: Payer: Self-pay

## 2021-02-20 ENCOUNTER — Ambulatory Visit
Admission: RE | Admit: 2021-02-20 | Discharge: 2021-02-20 | Disposition: A | Discharge status: Home / Self Care | Payer: 59 | Source: Ambulatory Visit | Attending: Emergency Medicine | Admitting: Emergency Medicine

## 2021-02-20 VITALS — BP 174/114 | HR 98 | Temp 98.5°F | Resp 18

## 2021-02-20 DIAGNOSIS — R059 Cough, unspecified: Secondary | ICD-10-CM

## 2021-02-20 DIAGNOSIS — R030 Elevated blood-pressure reading, without diagnosis of hypertension: Secondary | ICD-10-CM

## 2021-02-20 DIAGNOSIS — J0140 Acute pansinusitis, unspecified: Secondary | ICD-10-CM

## 2021-02-20 MED ORDER — DOXYCYCLINE HYCLATE 100 MG PO CAPS
100.0000 mg | ORAL_CAPSULE | Freq: Two times a day (BID) | ORAL | 0 refills | Status: DC
Start: 2021-02-20 — End: 2021-04-23

## 2021-02-20 MED ORDER — BENZONATATE 100 MG PO CAPS: 100.0000 mg | ORAL_CAPSULE | Freq: Three times a day (TID) | ORAL | 0 refills | Status: DC

## 2021-02-20 NOTE — Discharge Instructions (Signed)
Declines covid test at this time Get plenty of rest and push fluids Tessalon Perles prescribed for cough Doxycycline for sinus infection Use OTC zyrtec for nasal congestion, runny nose, and/or sore throat Use OTC flonase for nasal congestion and runny nose Use medications daily for symptom relief Use OTC medications like ibuprofen or tylenol as needed fever or pain Call or go to the ED if you have any new or worsening symptoms such as fever, worsening cough, shortness of breath, chest tightness, chest pain, turning blue, changes in mental status, etc...   Blood pressure elevated in office. Declines clonidine in office. Take BP medication as directed.  Please recheck in 24 hours.  If it continues to be greater than 140/90 please follow up with PCP for further evaluation and management.

## 2021-02-20 NOTE — ED Triage Notes (Signed)
Productive cough x 1 week greenish sputum.

## 2021-02-20 NOTE — ED Provider Notes (Signed)
[place]   [id number] 02/20/21 Arrival Time: 1001   CC: cough   SUBJECTIVE: History from: patient.  Renee Vargas is a 60 y.o. female who presents with sinus pain/ pressure, and productive cough with green sputum x 1 week.  Denies sick exposure to COVID, flu or strep.  Denies aggravating factors.  Symptoms are made worse at night.  Reports previous symptoms in the past.   Denies fever, chills, sore throat, SOB, wheezing, chest pain, nausea, changes in bowel or bladder habits.    Blood pressure elevated incidentally on exam.  174/114.  Normally 120/70.  Takes blood pressure medication at nighttime.  Has not taken BP medication in past two days due to current symptoms.  Denies chest pain, SOB, slurred speech, facial droop, weakness.     ROS: As per HPI.  All other pertinent ROS negative.     Past Medical History:  Diagnosis Date  . Anemia   . Anxiety   . History of gout   . Hypertension    Past Surgical History:  Procedure Laterality Date  . ABDOMINAL HYSTERECTOMY N/A 03/12/2014   Procedure: SUPRACERVICAL HYSTERECTOMY;  Surgeon: Jonnie Kind, MD;  Location: [facility];  Service: Gynecology;  Laterality: N/A;  . COLONOSCOPY N/A 07/13/2017   Procedure: COLONOSCOPY;  Surgeon: Danie Binder, MD;  [facility];  Service: Endoscopy;  Laterality: N/A;  . fatty mass removed Left    hip  . SALPINGOOPHORECTOMY Bilateral 03/12/2014   Procedure: BILATERAL SALPINGO OOPHORECTOMY;  Surgeon: Jonnie Kind, MD;  Location: [facility];  Service: Gynecology;  Laterality: Bilateral;  . TUBAL LIGATION     Allergies  Allergen Reactions  . Aleve [Naproxen Sodium] Hives   No current facility-administered medications on file prior to encounter.   Current Outpatient Medications on File Prior to Encounter  Medication Sig Dispense Refill  . amLODipine (NORVASC) 10 MG tablet Take 1 tablet by mouth daily.    Marland Kitchen atorvastatin (LIPITOR) 10 MG tablet Take 1 tablet by mouth daily.    Marland Kitchen  buPROPion (WELLBUTRIN XL) 150 MG 24 hr tablet Take 150 mg by mouth daily.    Marland Kitchen escitalopram (LEXAPRO) 10 MG tablet Take 10 mg by mouth every morning.    Marland Kitchen estradiol (ESTRACE) 2 MG tablet Take 1 tablet (2 mg total) by mouth daily. 30 tablet 11  . hydrochlorothiazide (HYDRODIURIL) 25 MG tablet Take 25 mg by mouth daily.    . meloxicam (MOBIC) 7.5 MG tablet Take 1 tablet by mouth daily as needed for severe pain.     Social History   Socioeconomic History  . Marital status: Divorced    Spouse name: Not on file  . Number of children: Not on file  . Years of education: Not on file  . Highest education level: Not on file  Occupational History  . Not on file  Tobacco Use  . Smoking status: Never Smoker  . Smokeless tobacco: Never Used  Substance and Sexual Activity  . Alcohol use: No  . Drug use: No  . Sexual activity: Yes    Birth control/protection: None  Other Topics Concern  . Not on file  Social History Narrative  . Not on file   Social Determinants of Health   Financial Resource Strain: Not on file  Food Insecurity: Not on file  Transportation Needs: Not on file  Physical Activity: Not on file  Stress: Not on file  Social Connections: Not on file  Intimate Partner Violence: Not on file  Family History  Problem Relation Age of Onset  . Hypertension Mother   . Hypertension Sister     OBJECTIVE:  Vitals:   02/20/21 1030 02/20/21 1032  BP: (!) 203/108 (S) (!) 174/114  Pulse: 98   Resp: 18   Temp: 98.5 F (36.9 C)   TempSrc: Oral   SpO2: 94%      General appearance: alert; appears fatigued, but nontoxic; speaking in full sentences and tolerating own secretions HEENT: NCAT; Ears: EACs clear, TMs pearly gray; Eyes: PERRL.  EOM grossly intact. Sinuses: TTP; Nose: nares patent without rhinorrhea, Throat: oropharynx clear, tonsils non erythematous or enlarged, uvula midline  Neck: supple without LAD Lungs: unlabored respirations, symmetrical air entry; cough: mild;  no respiratory distress; CTAB Heart: regular rate and rhythm.   Skin: warm and dry Psychological: alert and cooperative; normal mood and affect  ASSESSMENT & PLAN:  1. Cough   2. Acute non-recurrent pansinusitis   3. Elevated blood pressure reading     Meds ordered this encounter  Medications  . benzonatate (TESSALON) 100 MG capsule    Sig: Take 1 capsule (100 mg total) by mouth every 8 (eight) hours.    Dispense:  21 capsule    Refill:  0    Order Specific Question:   Supervising Provider    Answer:   Raylene Everts [[id number]]  . doxycycline (VIBRAMYCIN) 100 MG capsule    Sig: Take 1 capsule (100 mg total) by mouth 2 (two) times daily.    Dispense:  20 capsule    Refill:  0    Order Specific Question:   Supervising Provider    Answer:   Raylene Everts [[id number]]   Declines covid test at this time Get plenty of rest and push fluids Tessalon Perles prescribed for cough Doxycycline for sinus infection Use OTC zyrtec for nasal congestion, runny nose, and/or sore throat Use OTC flonase for nasal congestion and runny nose Use medications daily for symptom relief Use OTC medications like ibuprofen or tylenol as needed fever or pain Call or go to the ED if you have any new or worsening symptoms such as fever, worsening cough, shortness of breath, chest tightness, chest pain, turning blue, changes in mental status, etc...   Blood pressure elevated in office. Declines clonidine in office. Take BP medication as directed.  Please recheck in 24 hours.  If it continues to be greater than 140/90 please follow up with PCP for further evaluation and management.    Reviewed expectations re: course of current medical issues. Questions answered. Outlined signs and symptoms indicating need for more acute intervention. Patient verbalized understanding. After Visit Summary given.         Lestine Box, PA-C 02/20/21 1108

## 2021-04-23 ENCOUNTER — Ambulatory Visit (INDEPENDENT_AMBULATORY_CARE_PROVIDER_SITE_OTHER): Payer: 59 | Admitting: Orthopedic Surgery

## 2021-04-23 ENCOUNTER — Encounter: Payer: Self-pay | Admitting: Orthopedic Surgery

## 2021-04-23 ENCOUNTER — Other Ambulatory Visit: Payer: Self-pay

## 2021-04-23 VITALS — BP 176/99 | HR 78 | Ht 66.0 in | Wt 338.0 lb

## 2021-04-23 DIAGNOSIS — Z6841 Body Mass Index (BMI) 40.0 and over, adult: Secondary | ICD-10-CM

## 2021-04-23 DIAGNOSIS — M1711 Unilateral primary osteoarthritis, right knee: Secondary | ICD-10-CM | POA: Diagnosis not present

## 2021-04-23 NOTE — Progress Notes (Addendum)
Orthopaedic Clinic Return  Assessment: Renee Vargas is a 60 y.o. female with the following: Right knee arthritis; recent exacerbation.  Plan: Patient had been doing well following an injection 5-6 months ago in her right knee.  However, a recent fall exacerbated her pain.  She has resumed the physical therapy exercises that provided her with substantial relief.  She is also taking the appropriate medications.  She had an excellent response to the previous injection, and I offered her another injection today.  She elected to proceed.  If she struggles with the pain at this time, we could consider repeat referral to physical therapy, although she has all of the exercises that were successful before.  Medications as needed.  Follow-up as needed.  Procedure note injection Right knee joint   Verbal consent was obtained to inject the right knee joint  Timeout was completed to confirm the site of injection.  The skin was prepped with alcohol and ethyl chloride was sprayed at the injection site.  A 21-gauge needle was used to inject 6 mg betamethasone and 1% lidocaine (3 cc) into the right knee using an anterolateral approach.  There were no complications. A sterile bandage was applied.   Body mass index is 54.55 kg/m.  Follow-up: Return if symptoms worsen or fail to improve.   Subjective:  Chief Complaint  Patient presents with  . Knee Pain    Right/ increased pain x 3 weeks twisted on steps     History of Present Illness: Renee Vargas is a 60 y.o. female who presents for repeat evaluation of her right knee.  She was seen in clinic in November, 2021, at which time she had a right knee steroid injection.  She also went to physical therapy.  She was doing quite well, and only occasionally taking NSAIDs for pain.  Approximately 3-4 weeks ago, she twisted her right knee on some steps and noted immediate pain.  The pain progressively worsened.  She has resumed the physical therapy  exercises that were recommended to her, and this has improved her pain.  She is also taking medications more frequently.  She is frustrated because she had improved since the last visit, and is hopeful that a repeat steroid injection can help in the same way.  Review of Systems: No fevers or chills No numbness or tingling No chest pain No shortness of breath No bowel or bladder dysfunction No GI distress No headaches    Objective: BP (!) 176/99   Pulse 78   Ht 5\' 6"  (1.676 m)   Wt (!) 338 lb (153.3 kg)   LMP 02/21/2014   BMI 54.55 kg/m   Physical Exam:  And oriented.  No acute distress.  Obese female.  Evaluation of the right knee demonstrates a large knee.  It is difficult to tell if she has an effusion.  She has tenderness to palpation along the medial and lateral joint lines.  No bruising is appreciated.  Negative Lachman.  No increased laxity varus valgus stress.  Toes are warm and well-perfused.  Tenderness to palpation over the anterior knee.  IMAGING: I personally ordered and reviewed the following images:   No new imaging obtained today.  Mordecai Rasmussen, MD 04/23/2021 5:13 PM

## 2021-04-23 NOTE — Patient Instructions (Signed)

## 2021-06-24 ENCOUNTER — Ambulatory Visit
Admission: EM | Admit: 2021-06-24 | Discharge: 2021-06-24 | Disposition: A | Discharge status: Home / Self Care | Payer: 59 | Attending: Emergency Medicine | Admitting: Emergency Medicine

## 2021-06-24 ENCOUNTER — Encounter: Payer: Self-pay | Admitting: Emergency Medicine

## 2021-06-24 ENCOUNTER — Other Ambulatory Visit: Payer: Self-pay

## 2021-06-24 DIAGNOSIS — Z1152 Encounter for screening for COVID-19: Secondary | ICD-10-CM

## 2021-06-24 DIAGNOSIS — T7840XA Allergy, unspecified, initial encounter: Secondary | ICD-10-CM

## 2021-06-24 MED ORDER — KETOTIFEN FUMARATE 0.025 % OP SOLN: 1.0000 [drp] | Freq: Two times a day (BID) | OPHTHALMIC | 0 refills | Status: DC

## 2021-06-24 MED ORDER — LEVOCETIRIZINE DIHYDROCHLORIDE 5 MG PO TABS: 5.0000 mg | ORAL_TABLET | Freq: Every evening | ORAL | 0 refills | Status: DC

## 2021-06-24 NOTE — ED Provider Notes (Signed)
[place]   [id number] 06/24/21 Arrival Time: 2706   CC: allergies  SUBJECTIVE: History from: patient.  Renee Vargas is a 60 y.o. female who presents with watery eyes, sinus congestion and dry cough x few days ago.  Admits to going to a cookout and being outside prior to symptoms.  Has tried OTC medication without relief.  Symptoms are made worse with laying down.  Reports previous symptoms in the past.   Denies fever, chills, fatigue, sore throat, SOB, wheezing, chest pain, nausea, changes in bowel or bladder habits.    ROS: As per HPI.  All other pertinent ROS negative.     Past Medical History:  Diagnosis Date   Anemia    Anxiety    History of gout    Hypertension    Past Surgical History:  Procedure Laterality Date   ABDOMINAL HYSTERECTOMY N/A 03/12/2014   Procedure: SUPRACERVICAL HYSTERECTOMY;  Surgeon: Jonnie Kind, MD;  Location: [facility];  Service: Gynecology;  Laterality: N/A;   COLONOSCOPY N/A 07/13/2017   Procedure: COLONOSCOPY;  Surgeon: Danie Binder, MD;  [facility];  Service: Endoscopy;  Laterality: N/A;   fatty mass removed Left    hip   SALPINGOOPHORECTOMY Bilateral 03/12/2014   Procedure: BILATERAL SALPINGO OOPHORECTOMY;  Surgeon: Jonnie Kind, MD;  Location: [facility];  Service: Gynecology;  Laterality: Bilateral;   TUBAL LIGATION     Allergies  Allergen Reactions   Aleve [Naproxen Sodium] Hives   No current facility-administered medications on file prior to encounter.   Current Outpatient Medications on File Prior to Encounter  Medication Sig Dispense Refill   amLODipine (NORVASC) 10 MG tablet Take 1 tablet by mouth daily.     atorvastatin (LIPITOR) 10 MG tablet Take 1 tablet by mouth daily. (Patient not taking: Reported on 04/23/2021)     buPROPion (WELLBUTRIN XL) 150 MG 24 hr tablet Take 150 mg by mouth daily.     escitalopram (LEXAPRO) 10 MG tablet Take 10 mg by mouth every morning.     estradiol (ESTRACE) 2 MG tablet Take  1 tablet (2 mg total) by mouth daily. 30 tablet 11   hydrochlorothiazide (HYDRODIURIL) 25 MG tablet Take 25 mg by mouth daily.     meloxicam (MOBIC) 7.5 MG tablet Take 1 tablet by mouth daily as needed for severe pain.     Social History   Socioeconomic History   Marital status: Divorced    Spouse name: Not on file   Number of children: Not on file   Years of education: Not on file   Highest education level: Not on file  Occupational History   Not on file  Tobacco Use   Smoking status: Never   Smokeless tobacco: Never  Substance and Sexual Activity   Alcohol use: No   Drug use: No   Sexual activity: Yes    Birth control/protection: None  Other Topics Concern   Not on file  Social History Narrative   Not on file   Social Determinants of Health   Financial Resource Strain: Not on file  Food Insecurity: Not on file  Transportation Needs: Not on file  Physical Activity: Not on file  Stress: Not on file  Social Connections: Not on file  Intimate Partner Violence: Not on file   Family History  Problem Relation Age of Onset   Hypertension Mother    Hypertension Sister     OBJECTIVE:  Vitals:   06/24/21 1627  BP: (!) 173/97  Pulse: 81  Resp: 17  Temp: 98.4 F (36.9 C)  TempSrc: Oral  SpO2: 97%    General appearance: alert; well-appearing, nontoxic; speaking in full sentences and tolerating own secretions HEENT: NCAT; Ears: EACs clear, TMs pearly gray; Eyes: PERRL.  EOM grossly intact.Nose: nares patent without rhinorrhea, Throat: oropharynx clear, tonsils non erythematous or enlarged, uvula midline  Neck: supple without LAD Lungs: unlabored respirations, symmetrical air entry; cough: absent; no respiratory distress; CTAB Heart: regular rate and rhythm.  Skin: warm and dry Psychological: alert and cooperative; normal mood and affect   ASSESSMENT & PLAN:  1. Encounter for screening for COVID-19   2. Allergy, initial encounter     Meds ordered this encounter   Medications   ketotifen (ZADITOR) 0.025 % ophthalmic solution    Sig: Place 1 drop into both eyes 2 (two) times daily.    Dispense:  5 mL    Refill:  0    Order Specific Question:   Supervising Provider    Answer:   Raylene Everts [[id number]]   levocetirizine (XYZAL) 5 MG tablet    Sig: Take 1 tablet (5 mg total) by mouth every evening.    Dispense:  30 tablet    Refill:  0    Order Specific Question:   Supervising Provider    Answer:   Raylene Everts [[id number]]    Get plenty of rest and push fluids Xyzal and zaditor prescribed  Tessalon Perles prescribed for cough Use OTC flonase for nasal congestion and runny nose Use medications daily for symptom relief Use OTC medications like ibuprofen or tylenol as needed fever or pain Call or go to the ED if you have any new or worsening symptoms such as fever, worsening cough, shortness of breath, chest tightness, chest pain, turning blue, changes in mental status, etc...   Reviewed expectations re: course of current medical issues. Questions answered. Outlined signs and symptoms indicating need for more acute intervention. Patient verbalized understanding. After Visit Summary given.          Lestine Box, PA-C 06/24/21 1648

## 2021-06-24 NOTE — ED Triage Notes (Signed)
Dry cough during the day and constant at night.  Eyes are watering and matted shut in the morning.

## 2021-06-24 NOTE — Discharge Instructions (Addendum)
Get plenty of rest and push fluids Xyzal and zaditor prescribed  Tessalon Perles prescribed for cough Use OTC flonase for nasal congestion and runny nose Use medications daily for symptom relief Use OTC medications like ibuprofen or tylenol as needed fever or pain Call or go to the ED if you have any new or worsening symptoms such as fever, worsening cough, shortness of breath, chest tightness, chest pain, turning blue, changes in mental status, etc..Marland Kitchen

## 2021-06-25 ENCOUNTER — Telehealth: Payer: Self-pay

## 2021-06-25 LAB — SARS-COV-2, NAA 2 DAY TAT

## 2021-06-25 LAB — NOVEL CORONAVIRUS, NAA: SARS-CoV-2, NAA: NOT DETECTED

## 2021-06-25 MED ORDER — BENZONATATE 100 MG PO CAPS
100.0000 mg | ORAL_CAPSULE | Freq: Three times a day (TID) | ORAL | 0 refills | Status: DC
Start: 2021-06-25 — End: 2021-09-10

## 2021-09-07 ENCOUNTER — Other Ambulatory Visit (HOSPITAL_COMMUNITY): Payer: Self-pay | Admitting: Family Medicine

## 2021-09-07 DIAGNOSIS — Z1231 Encounter for screening mammogram for malignant neoplasm of breast: Secondary | ICD-10-CM

## 2021-09-10 ENCOUNTER — Encounter: Payer: Self-pay | Admitting: Orthopedic Surgery

## 2021-09-10 ENCOUNTER — Other Ambulatory Visit: Payer: Self-pay

## 2021-09-10 ENCOUNTER — Ambulatory Visit (INDEPENDENT_AMBULATORY_CARE_PROVIDER_SITE_OTHER): Payer: 59 | Admitting: Orthopedic Surgery

## 2021-09-10 VITALS — Ht 66.0 in | Wt 336.0 lb

## 2021-09-10 DIAGNOSIS — M1711 Unilateral primary osteoarthritis, right knee: Secondary | ICD-10-CM | POA: Diagnosis not present

## 2021-09-10 MED ORDER — DICLOFENAC SODIUM 50 MG PO TBEC: 50.0000 mg | DELAYED_RELEASE_TABLET | Freq: Two times a day (BID) | ORAL | 0 refills | Status: DC

## 2021-09-10 NOTE — Progress Notes (Signed)
Orthopaedic Clinic Return  Assessment: ELLINOR TEST is a 60 y.o. female with the following: Right knee arthritis; recent exacerbation.  Plan: Patient had good resolution of her symptoms following the most recent injection.  However, she states that her mother passed away approximately 2 months ago, and since then, her pain has been progressively worsening.  She is interested in a repeat injection today.  She also has some tenderness over the Pes anserine, and I have advised her to use Voltaren gel for this pain.  If she continues to have tenderness in this area, we could consider an injection of the bursa.  She was fitted for a brace.  She was also provided with diclofenac prescription.  Follow-up as needed.  Procedure note injection Right knee joint   Verbal consent was obtained to inject the right knee joint  Timeout was completed to confirm the site of injection.  The skin was prepped with alcohol and ethyl chloride was sprayed at the injection site.  A 21-gauge needle was used to inject 6 mg betamethasone and 1% lidocaine (3 cc) into the right knee using an anterolateral approach.  There were no complications. A sterile bandage was applied.   Body mass index is 54.23 kg/m.  The patient meets the AMA guidelines for Morbid obesity with BMI > 40.  The patient has been counseled on weight loss.    Follow-up: Return if symptoms worsen or fail to improve.   Subjective:  Chief Complaint  Patient presents with   Knee Pain    Rt knee pain for 2 wks. Pt states she had     History of Present Illness: WAYNESHA RAMMEL is a 60 y.o. female who presents for repeat evaluation of her right knee.  She was last seen in May, at which time she received a second right knee steroid injection.  This provided excellent relief of her symptoms.  She states that her mother got sick in early August, and subsequently passed away.  Throughout this process, she has noticed progressive worsening of the pain  in her right knee.  No recent injuries.  Pain is deep within the knee, as well as over the proximal tibia, and the medial side.  She has been taking ibuprofen as needed.  Review of Systems: No fevers or chills No numbness or tingling No chest pain No shortness of breath No bowel or bladder dysfunction No GI distress No headaches    Objective: Ht 5\' 6"  (1.676 m)   Wt (!) 336 lb (152.4 kg)   LMP 02/21/2014   BMI 54.23 kg/m   Physical Exam:  And oriented.  No acute distress.  Obese female.  Evaluation of the right knee demonstrates a large knee.  It is difficult to tell if she has an effusion.  She has tenderness to palpation along the medial and lateral joint lines.  No bruising is appreciated.  Negative Lachman.  No increased laxity varus valgus stress.  Toes are warm and well-perfused.  Tenderness to palpation over the anterior knee.  Tenderness to palpation over the Pes anserine tendons.  IMAGING: I personally ordered and reviewed the following images:   No new imaging obtained today.  Mordecai Rasmussen, MD 09/10/2021 12:35 PM

## 2021-09-10 NOTE — Patient Instructions (Signed)

## 2021-09-15 ENCOUNTER — Other Ambulatory Visit: Payer: Self-pay

## 2021-09-15 ENCOUNTER — Ambulatory Visit (HOSPITAL_COMMUNITY)
Admission: RE | Admit: 2021-09-15 | Discharge: 2021-09-15 | Disposition: A | Discharge status: Home / Self Care | Payer: 59 | Source: Ambulatory Visit | Attending: Family Medicine | Admitting: Family Medicine

## 2021-09-15 DIAGNOSIS — Z1231 Encounter for screening mammogram for malignant neoplasm of breast: Secondary | ICD-10-CM | POA: Insufficient documentation

## 2021-09-20 ENCOUNTER — Telehealth: Payer: Self-pay | Admitting: Orthopedic Surgery

## 2021-09-20 NOTE — Telephone Encounter (Signed)
Patient called and wants to know when will her brace be in.  Please call her back at [phone]

## 2021-09-22 NOTE — Telephone Encounter (Signed)
Emailed supply representative to check status, informed brace is on the way and should be her by EOD 10/7. Called pt and let her know this information, and that I will call as soon as I get it. Pt verbalized understanding.

## 2022-01-07 ENCOUNTER — Ambulatory Visit (INDEPENDENT_AMBULATORY_CARE_PROVIDER_SITE_OTHER): Payer: 59 | Admitting: Orthopedic Surgery

## 2022-01-07 ENCOUNTER — Other Ambulatory Visit: Payer: Self-pay

## 2022-01-07 ENCOUNTER — Encounter: Payer: Self-pay | Admitting: Orthopedic Surgery

## 2022-01-07 VITALS — Ht 66.0 in | Wt 336.0 lb

## 2022-01-07 DIAGNOSIS — M1711 Unilateral primary osteoarthritis, right knee: Secondary | ICD-10-CM | POA: Diagnosis not present

## 2022-01-07 MED ORDER — IBUPROFEN 800 MG PO TABS: 800.0000 mg | ORAL_TABLET | Freq: Three times a day (TID) | ORAL | 1 refills | Status: DC | PRN

## 2022-01-07 NOTE — Progress Notes (Addendum)
Orthopaedic Clinic Return  Assessment: Renee Vargas is a 61 y.o. female with the following: Right knee arthritis; recent exacerbation.  Plan: Injections have been effective.  Most recent injection lasted 3 months, which she did wait an additional month due to some confusion on her part.  She is interested in another injection today.  We will provide her with an updated prescription for ibuprofen.  Follow-up as needed.  Procedure note injection Right knee joint   Verbal consent was obtained to inject the right knee joint  Timeout was completed to confirm the site of injection.  The skin was prepped with alcohol and ethyl chloride was sprayed at the injection site.  A 21-gauge needle was used to inject 40 mg Depo-Medrol and 1% lidocaine (3 cc) into the right knee using an anterolateral approach.  There were no complications. A sterile bandage was applied.   Body mass index is 54.23 kg/m.  The patient meets the AMA guidelines for Morbid obesity with BMI > 40.  The patient has been counseled on weight loss.    Follow-up: Return if symptoms worsen or fail to improve.   Subjective:  Chief Complaint  Patient presents with   Knee Pain    Rt knee last injection 09/10/21    History of Present Illness: Renee Vargas is a 62 y.o. female who presents for repeat evaluation of her right knee.  I have seen her in clinic multiple times, and she has been diagnosed with severe right knee arthritis.  Injections have helped.  Most recent injection provided improvement in her symptoms for at least 3 months.  Unfortunately, she did not realize that she had the injection in September, and thus waited an additional month return to clinic.  Pain remains the same.  She is tried a brace, but these do not fit well.  She previously took diclofenac, with limited improvement in her symptoms.  She has recently tried ibuprofen, which is helped with some of the inflammation and stiffness in the right  knee.  Review of Systems: No fevers or chills No numbness or tingling No chest pain No shortness of breath No bowel or bladder dysfunction No GI distress No headaches    Objective: Ht 5\' 6"  (1.676 m)    Wt (!) 336 lb (152.4 kg)    LMP 02/21/2014    BMI 54.23 kg/m   Physical Exam:  And oriented.  No acute distress.  Obese female.  Evaluation of the right knee demonstrates a large knee.  It is difficult to tell if she has an effusion.  She has tenderness to palpation along the medial and lateral joint lines.  No bruising is appreciated.  Negative Lachman.  No increased laxity varus valgus stress.  Toes are warm and well-perfused.  Tenderness to palpation over the anterior knee.  Tenderness to palpation over the Pes anserine tendons.  IMAGING: I personally ordered and reviewed the following images:   No new imaging obtained today.  Mordecai Rasmussen, MD 01/07/2022 10:49 AM

## 2022-01-07 NOTE — Patient Instructions (Signed)

## 2022-01-16 ENCOUNTER — Ambulatory Visit
Admission: EM | Admit: 2022-01-16 | Discharge: 2022-01-16 | Disposition: A | Discharge status: Home / Self Care | Payer: 59 | Attending: Internal Medicine | Admitting: Internal Medicine

## 2022-01-16 ENCOUNTER — Other Ambulatory Visit: Payer: Self-pay

## 2022-01-16 DIAGNOSIS — M79642 Pain in left hand: Secondary | ICD-10-CM

## 2022-01-16 DIAGNOSIS — M79641 Pain in right hand: Secondary | ICD-10-CM | POA: Diagnosis not present

## 2022-01-16 DIAGNOSIS — M25511 Pain in right shoulder: Secondary | ICD-10-CM

## 2022-01-16 MED ORDER — PREDNISONE 20 MG PO TABS: 40.0000 mg | ORAL_TABLET | Freq: Every day | ORAL | 0 refills | Status: AC

## 2022-01-16 NOTE — ED Triage Notes (Signed)
Pt c/o RUE/right shoulder pain concerned for arthritis. Also c/o tendonitis in bilat hands. Onset ~ 1.5 weeks states worse at night time. 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

## 2022-01-16 NOTE — ED Provider Notes (Addendum)
[facility]    CSN: [id number] Arrival date & time: 01/16/22  1337      History   Chief Complaint Chief Complaint  Patient presents with   right shoulder pain    HPI Renee Vargas is a 61 y.o. female.   Patient presents with right shoulder pain and bilateral hand pain that has been present for approximately 1.5 weeks.  Denies any apparent injury to either area.  Denies any chronic pain to the area.  Patient does have history of arthritis in her knee, though.  She reports that only the second and third digits of bilateral hands are painful.  She has not taken any medications to help alleviate symptoms.  Denies any numbness or tingling to the areas.  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  Pain is exacerbated with movement.  She also has elevated blood pressure reading in urgent care today but reports that she has not taken her blood pressure medication today.  Denies chest pain, headache, shortness of breath, blurred vision, dizziness, nausea, vomiting.    Past Medical History:  Diagnosis Date   Anemia    Anxiety    History of gout    Hypertension     Patient Active Problem List   Diagnosis Date Noted   Special screening for malignant neoplasms, colon    Postoperative state 03/26/2014   Status post abdominal supracervical subtotal hysterectomy 03/12/2014   Fibroids, intramural 02/28/2014   Menorrhagia 02/28/2014   Fibroids 02/19/2014   Anemia 02/19/2014   Heavy menstrual bleeding 02/19/2014   Fibroid uterus 11/07/2013    Past Surgical History:  Procedure Laterality Date   ABDOMINAL HYSTERECTOMY N/A 03/12/2014   Procedure: SUPRACERVICAL HYSTERECTOMY;  Surgeon: Jonnie Kind, MD;  Location: [facility];  Service: Gynecology;  Laterality: N/A;   COLONOSCOPY N/A 07/13/2017   Procedure: COLONOSCOPY;  Surgeon:  Danie Binder, MD;  Location: [facility];  Service: Endoscopy;  Laterality: N/A;   fatty mass removed Left    hip   SALPINGOOPHORECTOMY Bilateral 03/12/2014   Procedure: BILATERAL SALPINGO OOPHORECTOMY;  Surgeon: Jonnie Kind, MD;  Location: [facility];  Service: Gynecology;  Laterality: Bilateral;   TUBAL LIGATION      OB History   No obstetric history on file.      Home Medications    Prior to Admission medications   Medication Sig Start Date End Date Taking? Authorizing Provider  predniSONE (DELTASONE) 20 MG tablet Take 2 tablets (40 mg total) by mouth daily for 5 days. 01/16/22 01/21/22 Yes Daimon Kean, Michele Rockers, FNP  amLODipine (NORVASC) 10 MG tablet Take 1 tablet by mouth daily. 06/24/17   [provider]  buPROPion (WELLBUTRIN XL) 150 MG 24 hr tablet Take 150 mg by mouth daily. 08/06/20   [provider]  diclofenac (VOLTAREN) 50 MG EC tablet Take 1 tablet (50 mg total) by mouth 2 (two) times daily. 09/10/21   Mordecai Rasmussen, MD  escitalopram (LEXAPRO) 10 MG tablet Take 10 mg by mouth every morning. 08/06/20   [provider]  estradiol (ESTRACE) 2 MG tablet Take 1 tablet (2 mg total) by mouth daily. 03/14/14   Jonnie Kind, MD  hydrochlorothiazide (HYDRODIURIL) 25 MG tablet Take 25 mg by mouth daily.    [provider]  ibuprofen (ADVIL) 800 MG tablet Take  1 tablet (800 mg total) by mouth every 8 (eight) hours as needed. 01/07/22   Mordecai Rasmussen, MD  meloxicam (MOBIC) 7.5 MG tablet Take 1 tablet by mouth daily as needed for severe pain. 06/02/17   [provider]    Family History Family History  Problem Relation Age of Onset   Hypertension Mother    Hypertension Sister     Social History Social History   Tobacco Use   Smoking status: Never   Smokeless tobacco: Never  Substance Use Topics   Alcohol use: No   Drug use: No     Allergies   Aleve [naproxen sodium]   Review of Systems Review of Systems Per HPI  Physical  Exam Triage Vital Signs ED Triage Vitals  Enc Vitals Group     BP 01/16/22 1429 (!) 189/110     Pulse Rate 01/16/22 1429 74     Resp 01/16/22 1429 18     Temp 01/16/22 1429 98 F (36.7 C)     Temp Source 01/16/22 1429 Oral     SpO2 01/16/22 1429 98 %     Weight --      Height --      Head Circumference --      Peak Flow --      Pain Score 01/16/22 1430 0     Pain Loc --      Pain Edu? --      Excl. in [place]? --    No data found.  Updated Vital Signs BP (!) 189/110 (BP Location: Right Arm)    Pulse 74    Temp 98 F (36.7 C) (Oral)    Resp 18    LMP 02/21/2014    SpO2 98%   Visual Acuity Right Eye Distance:   Left Eye Distance:   Bilateral Distance:    Right Eye Near:   Left Eye Near:    Bilateral Near:     Physical Exam Constitutional:      General: She is not in acute distress.    Appearance: Normal appearance. She is not toxic-appearing or diaphoretic.  HENT:     Head: Normocephalic and atraumatic.  Eyes:     Extraocular Movements: Extraocular movements intact.     Conjunctiva/sclera: Conjunctivae normal.  Cardiovascular:     Rate and Rhythm: Normal rate and regular rhythm.     Pulses: Normal pulses.     Heart sounds: Normal heart sounds.  Pulmonary:     Effort: Pulmonary effort is normal. No respiratory distress.     Breath sounds: Normal breath sounds.  Musculoskeletal:     Right shoulder: Tenderness present. No swelling, deformity, bony tenderness or crepitus. Normal range of motion. Normal strength. Normal pulse.     Left shoulder: Normal.     Right hand: Tenderness and bony tenderness present. No swelling or deformity. Normal range of motion. Normal strength. Normal sensation. There is no disruption of two-point discrimination. Normal capillary refill. Normal pulse.     Left hand: Tenderness and bony tenderness present. No swelling or deformity. Normal range of motion. Normal strength. Normal sensation. There is no disruption of two-point discrimination.  Normal capillary refill. Normal pulse.       Arms:     Comments: Tenderness to palpation and with range of motion to area circled on diagram at right upper extremity.  Patient also has tenderness to palpation to bilateral second and third digits at PIP joints.  Range of motion is normal of fingers.  There is mild swelling noted at PIP joint as well.  Grip strength 5/5.  Neurovascular intact.  Negative Phalen's and Tinel's sign.  Neurological:     General: No focal deficit present.     Mental Status: She is alert and oriented to person, place, and time. Mental status is at baseline.     Cranial Nerves: Cranial nerves 2-12 are intact.     Sensory: Sensation is intact.     Motor: Motor function is intact.     Coordination: Coordination is intact.     Gait: Gait is intact.  Psychiatric:        Mood and Affect: Mood normal.        Behavior: Behavior normal.        Thought Content: Thought content normal.        Judgment: Judgment normal.     UC Treatments / Results  Labs (all labs ordered are listed, but only abnormal results are displayed) Labs Reviewed - No data to display  EKG   Radiology No results found.  Procedures Procedures (including critical care time)  Medications Ordered in UC Medications - No data to display  Initial Impression / Assessment and Plan / UC Course  I have reviewed the triage vital signs and the nursing notes.  Pertinent labs & imaging results that were available during my care of the patient were reviewed by me and considered in my medical decision making (see chart for details).     Differential diagnoses of shoulder include osteoarthritis or muscular strain.  Differential diagnoses of bilateral hands include osteoarthritis, rheumatoid arthritis, carpal tunnel syndrome.  Low suspicion for carpal tunnel syndrome.  Will prescribe prednisone steroid to decrease patient's inflammation.  Do not think that imaging is necessary of either location given no  apparent injury.  Patient will need to follow-up with orthopedist for further evaluation and management.  She was provided with this contact information.  Patient has elevated blood pressure reading as well but reports that she did not take her blood pressure medication.  Advised patient to restart medication as prescribed.  No signs of hypertensive urgency, neuro exam is normal, no signs of endorgan damage so do not think the patient is in need of immediate medical attention at the hospital at this time.  Discussed return precautions.  Patient verbalized understanding and was agreeable with plan. Final Clinical Impressions(s) / UC Diagnoses   Final diagnoses:  Acute pain of right shoulder  Bilateral hand pain     Discharge Instructions      You have been prescribed prednisone steroid to decrease inflammation.  You may also apply ice to affected areas.  Follow-up with provided contact information for orthopedist for further evaluation and management.  Please restart your blood pressure medication as prescribed.    ED Prescriptions     Medication Sig Dispense Auth. Provider   predniSONE (DELTASONE) 20 MG tablet Take 2 tablets (40 mg total) by mouth daily for 5 days. 10 tablet Teodora Medici, [place]      PDMP not reviewed this encounter.   Teodora Medici, [place] 01/16/22 [place], [place], [place] 01/16/22 1529

## 2022-01-16 NOTE — Discharge Instructions (Addendum)
You have been prescribed prednisone steroid to decrease inflammation.  You may also apply ice to affected areas.  Follow-up with provided contact information for orthopedist for further evaluation and management.  Please restart your blood pressure medication as prescribed.

## 2022-05-29 ENCOUNTER — Encounter: Payer: Self-pay | Admitting: Emergency Medicine

## 2022-05-29 ENCOUNTER — Ambulatory Visit
Admission: EM | Admit: 2022-05-29 | Discharge: 2022-05-29 | Disposition: A | Discharge status: Home / Self Care | Payer: 59 | Attending: Nurse Practitioner | Admitting: Nurse Practitioner

## 2022-05-29 DIAGNOSIS — M7989 Other specified soft tissue disorders: Secondary | ICD-10-CM | POA: Insufficient documentation

## 2022-05-29 LAB — URIC ACID: Uric Acid, Serum: 7 mg/dL (ref 2.5–7.1)

## 2022-05-29 MED ORDER — PREDNISONE 50 MG PO TABS: ORAL_TABLET | ORAL | 0 refills | Status: DC

## 2022-05-29 NOTE — ED Provider Notes (Signed)
[facility]    CSN: [id number] Arrival date & time: 05/29/22  0954      History   Chief Complaint No chief complaint on file.   HPI Renee Vargas is a 61 y.o. female.   HPI Patient presents with pain in the left big toe that started 2 days ago.  Patient states the night prior she ate spaghetti with tomato sauce.  She states that she could feel the symptoms of gout starting in her foot.  Patient reports a history of gout.  Since the onset of her symptoms, she has had increased pain, and swelling in the left great toe.  She denies fever, chills, injury, trauma, numbness, tingling, or radiation of pain.  She has been taking cherry pills and ibuprofen to help with pain and swelling with no relief.  Patient states last episode of gout was several years ago.  Past Medical History:  Diagnosis Date   Anemia    Anxiety    History of gout    Hypertension     Patient Active Problem List   Diagnosis Date Noted   Special screening for malignant neoplasms, colon    Postoperative state 03/26/2014   Status post abdominal supracervical subtotal hysterectomy 03/12/2014   Fibroids, intramural 02/28/2014   Menorrhagia 02/28/2014   Fibroids 02/19/2014   Anemia 02/19/2014   Heavy menstrual bleeding 02/19/2014   Fibroid uterus 11/07/2013    Past Surgical History:  Procedure Laterality Date   ABDOMINAL HYSTERECTOMY N/A 03/12/2014   Procedure: SUPRACERVICAL HYSTERECTOMY;  Surgeon: Jonnie Kind, MD;  Location: [facility];  Service: Gynecology;  Laterality: N/A;   COLONOSCOPY N/A 07/13/2017   Procedure: COLONOSCOPY;  Surgeon: Danie Binder, MD;  Location: [facility];  Service: Endoscopy;  Laterality: N/A;   fatty mass removed Left    hip   SALPINGOOPHORECTOMY Bilateral 03/12/2014   Procedure: BILATERAL SALPINGO OOPHORECTOMY;  Surgeon: Jonnie Kind, MD;  Location: [facility];  Service: Gynecology;  Laterality: Bilateral;   TUBAL LIGATION      OB History   No obstetric history  on file.      Home Medications    Prior to Admission medications   Medication Sig Start Date End Date Taking? Authorizing Provider  predniSONE (DELTASONE) 50 MG tablet Take 1 tablet with breakfast daily for the next 5 days 05/29/22  Yes Din Bookwalter-Warren, Alda Lea, NP  amLODipine (NORVASC) 10 MG tablet Take 1 tablet by mouth daily. 06/24/17   [provider]  buPROPion (WELLBUTRIN XL) 150 MG 24 hr tablet Take 150 mg by mouth daily. 08/06/20   [provider]  diclofenac (VOLTAREN) 50 MG EC tablet Take 1 tablet (50 mg total) by mouth 2 (two) times daily. 09/10/21   Mordecai Rasmussen, MD  escitalopram (LEXAPRO) 10 MG tablet Take 10 mg by mouth every morning. 08/06/20   [provider]  estradiol (ESTRACE) 2 MG tablet Take 1 tablet (2 mg total) by mouth daily. 03/14/14   Jonnie Kind, MD  hydrochlorothiazide (HYDRODIURIL) 25 MG tablet Take 25 mg by mouth daily.    [provider]  ibuprofen (ADVIL) 800 MG tablet Take 1 tablet (800 mg total) by mouth every 8 (eight) hours as needed. 01/07/22   Mordecai Rasmussen, MD  meloxicam (MOBIC) 7.5 MG tablet Take 1 tablet by mouth daily as needed for severe pain. 06/02/17   [provider]    Family History Family History  Problem Relation Age of Onset   Hypertension Mother  Hypertension Sister     Social History Social History   Tobacco Use   Smoking status: Never   Smokeless tobacco: Never  Substance Use Topics   Alcohol use: No   Drug use: No     Allergies   Aleve [naproxen sodium]   Review of Systems Review of Systems Per HPI  Physical Exam Triage Vital Signs ED Triage Vitals  Enc Vitals Group     BP 05/29/22 1105 (!) 175/82     Pulse Rate 05/29/22 1105 73     Resp 05/29/22 1105 18     Temp 05/29/22 1105 97.6 F (36.4 C)     Temp Source 05/29/22 1105 Oral     SpO2 05/29/22 1105 98 %     Weight --      Height --      Head Circumference --      Peak Flow --      Pain Score 05/29/22  1107 10     Pain Loc --      Pain Edu? --      Excl. in [place]? --    No data found.  Updated Vital Signs BP (!) 175/82 (BP Location: Right Arm)   Pulse 73   Temp 97.6 F (36.4 C) (Oral)   Resp 18   LMP 02/21/2014   SpO2 98%   Visual Acuity Right Eye Distance:   Left Eye Distance:   Bilateral Distance:    Right Eye Near:   Left Eye Near:    Bilateral Near:     Physical Exam Vitals and nursing note reviewed.  Constitutional:      General: She is not in acute distress.    Appearance: She is well-developed.  Cardiovascular:     Rate and Rhythm: Normal rate and regular rhythm.     Heart sounds: Normal heart sounds.  Pulmonary:     Effort: Pulmonary effort is normal.     Breath sounds: Normal breath sounds.  Abdominal:     General: Bowel sounds are normal. There is no distension.     Palpations: Abdomen is soft.     Tenderness: There is no abdominal tenderness. There is no guarding or rebound.  Genitourinary:    Vagina: Normal. No vaginal discharge.  Musculoskeletal:     Comments: Swelling and erythema in the first metatarsal of the left foot.  First metatarsal is tender to palpation.  Pain with movement of great toe.  Skin:    General: Skin is warm and dry.     Findings: No erythema or rash.  Neurological:     Mental Status: She is alert and oriented to person, place, and time.     Cranial Nerves: No cranial nerve deficit.  Psychiatric:        Behavior: Behavior normal.      UC Treatments / Results  Labs (all labs ordered are listed, but only abnormal results are displayed) Labs Reviewed  URIC ACID    EKG   Radiology No results found.  Procedures Procedures (including critical care time)  Medications Ordered in UC Medications - No data to display  Initial Impression / Assessment and Plan / UC Course  I have reviewed the triage vital signs and the nursing notes.  Pertinent labs & imaging results that were available during my care of the patient  were reviewed by me and considered in my medical decision making (see chart for details).  Patient presents with swelling of the left great toe that has been  present for the past 2 days.  Patient reports history of gout.  Based on her exam, symptoms are consistent with a gout flare.  She has moderate swelling, and erythema of the first metatarsal.  We will start patient on prednisone for 5 days.  Discussion with patient regarding avoiding foods that may cause symptoms.  Uric acid level was collected today.  Patient was advised to follow-up with her primary care physician. Final Clinical Impressions(s) / UC Diagnoses   Final diagnoses:  Swelling of toe of left foot     Discharge Instructions      Take medication as prescribed. Avoid foods that may trigger your gout.  Additional information is attached. May apply ice to help with pain and swelling.  Apply for 20 minutes, remove for 1 hour, then repeat. May take Tylenol as needed to help with breakthrough pain. Your lab work should be available within the next 24 to 48 hours.  If you have access to MyChart, you will be able to see the results there. Follow-up with your primary care for reevaluation.     ED Prescriptions     Medication Sig Dispense Auth. Provider   predniSONE (DELTASONE) 50 MG tablet Take 1 tablet with breakfast daily for the next 5 days 5 tablet Margarito Dehaas-Warren, Alda Lea, NP      PDMP not reviewed this encounter.   Tish Men, NP 05/29/22 1133

## 2022-05-29 NOTE — ED Triage Notes (Signed)
Left great toe pain since Friday.  Skin red around the area.  History of gout.  Has been taking cherry pills to help with pain and swelling.

## 2022-05-29 NOTE — Discharge Instructions (Signed)
Take medication as prescribed. Avoid foods that may trigger your gout.  Additional information is attached. May apply ice to help with pain and swelling.  Apply for 20 minutes, remove for 1 hour, then repeat. May take Tylenol as needed to help with breakthrough pain. Your lab work should be available within the next 24 to 48 hours.  If you have access to MyChart, you will be able to see the results there. Follow-up with your primary care for reevaluation.

## 2022-06-09 ENCOUNTER — Encounter: Payer: Self-pay | Admitting: *Deleted

## 2022-07-13 IMAGING — MG MM DIGITAL SCREENING BILAT W/ TOMO AND CAD
8 of 14 series · 8 of 40 positions shown · non-contrast
Comparison: Previous exam(s).

CLINICAL DATA: Screening.

EXAM:
DIGITAL SCREENING BILATERAL MAMMOGRAM WITH TOMOSYNTHESIS AND CAD
TECHNIQUE: Bilateral screening digital craniocaudal and mediolateral oblique
mammograms were obtained. Bilateral screening digital breast
tomosynthesis was performed. The images were evaluated with
computer-aided detection.

[L MLO synth-2D (1 of 2)]
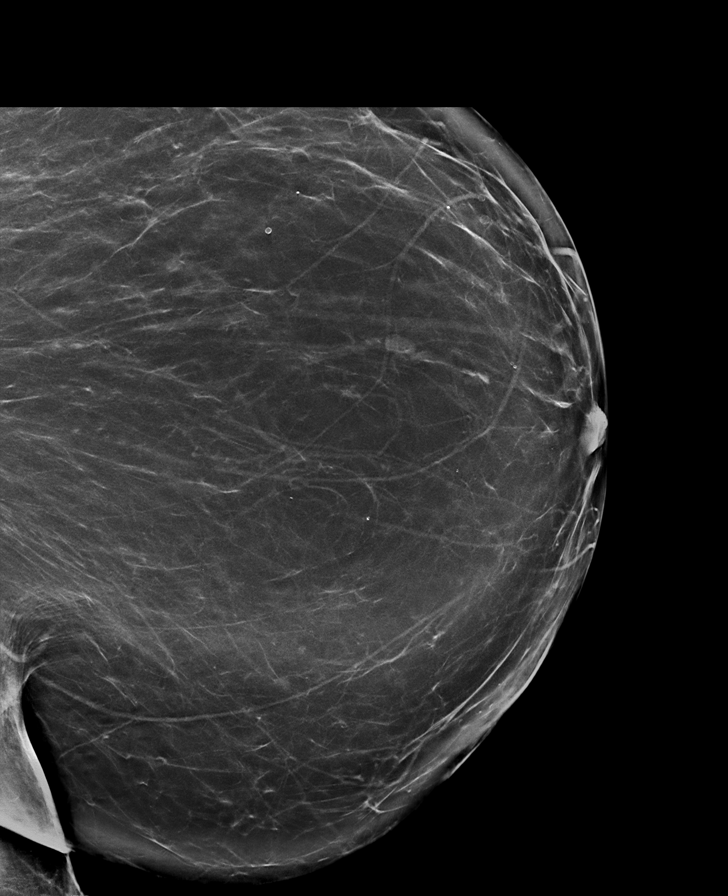

[R MLO synth-2D (1 of 2)]
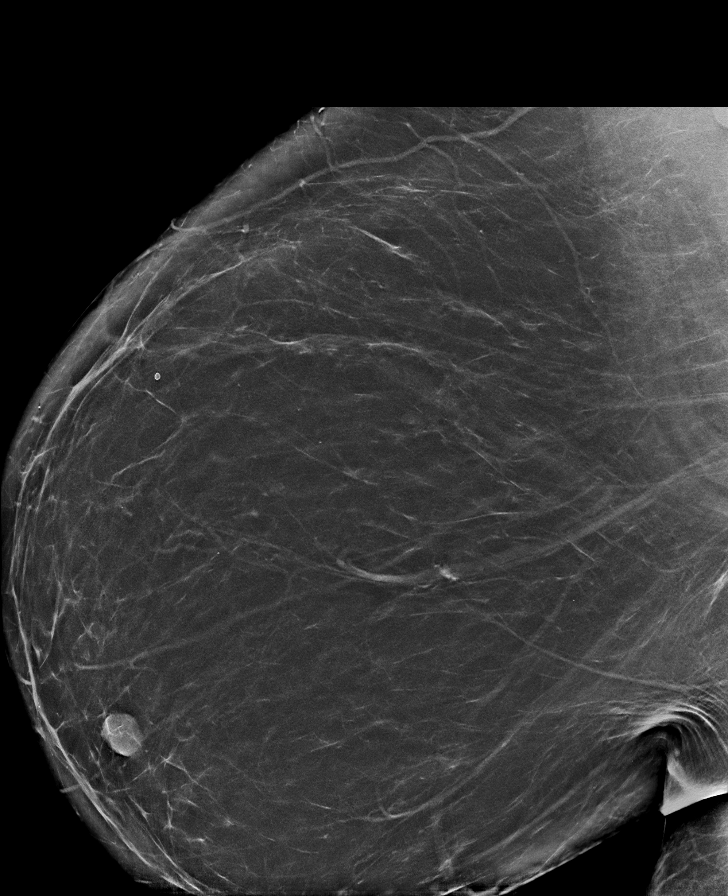

[L CC synth-2D]
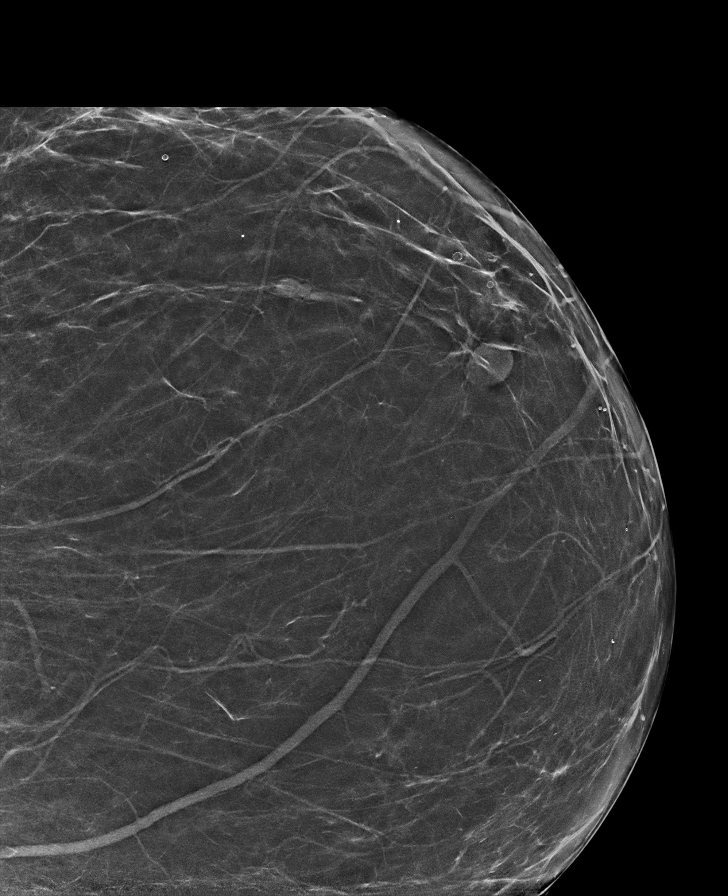

[R MLO synth-2D (2 of 2)]
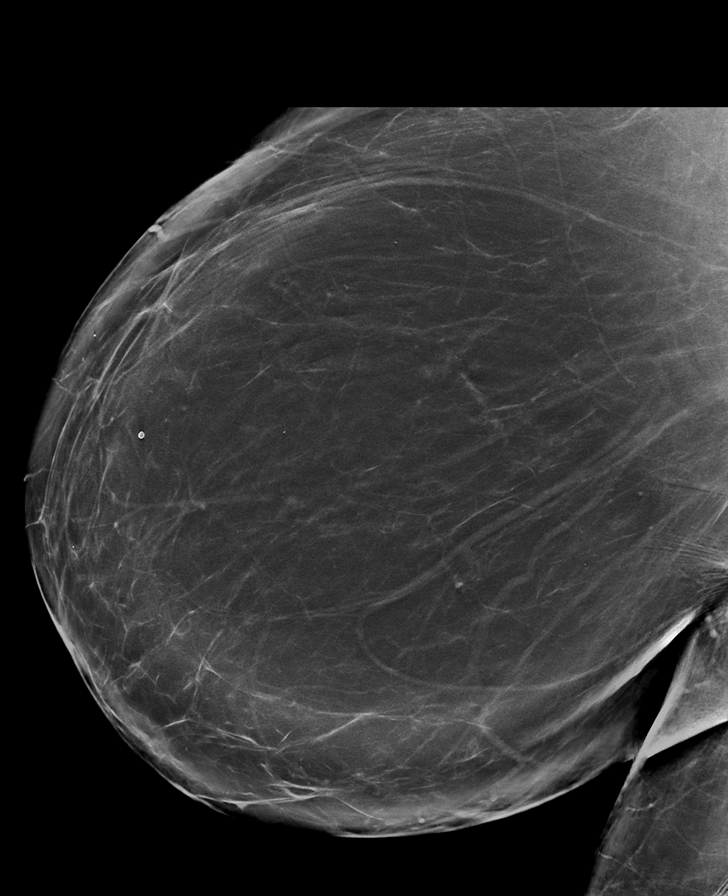

[L MLO synth-2D (2 of 2)]
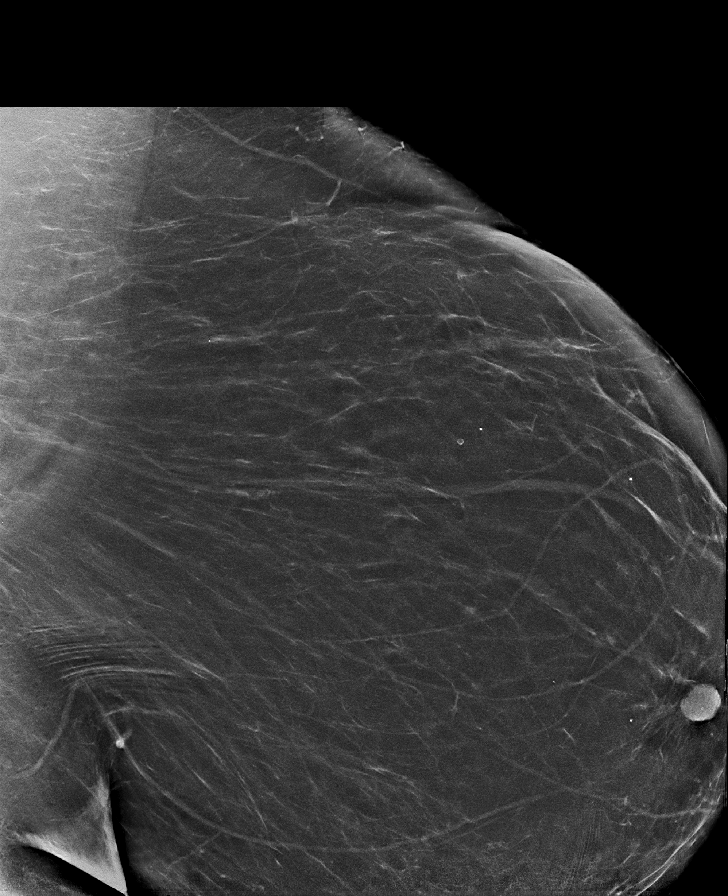

[R CC synth-2D]
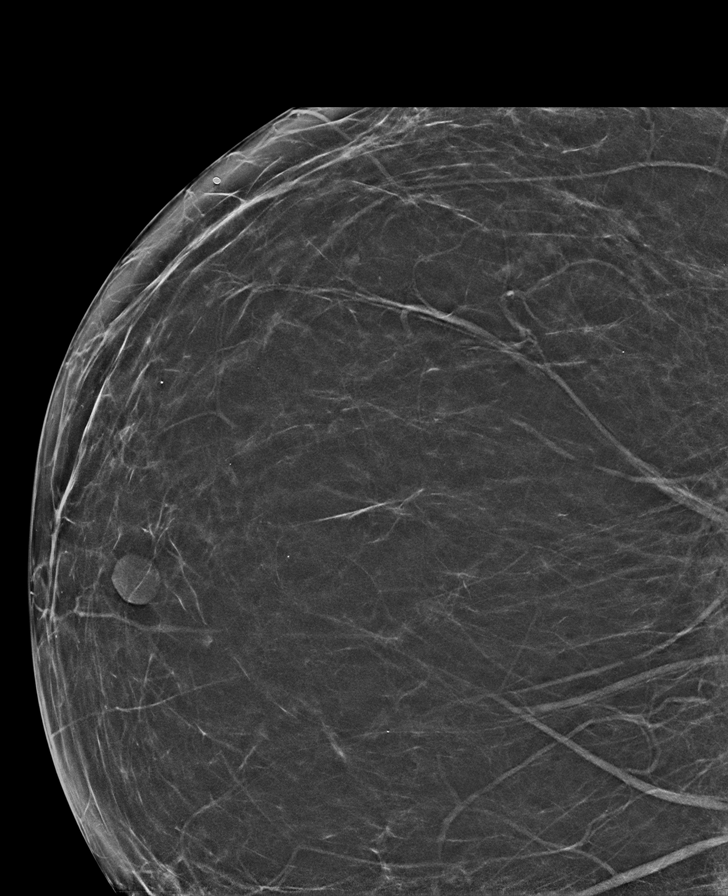

[R CV synth-2D]
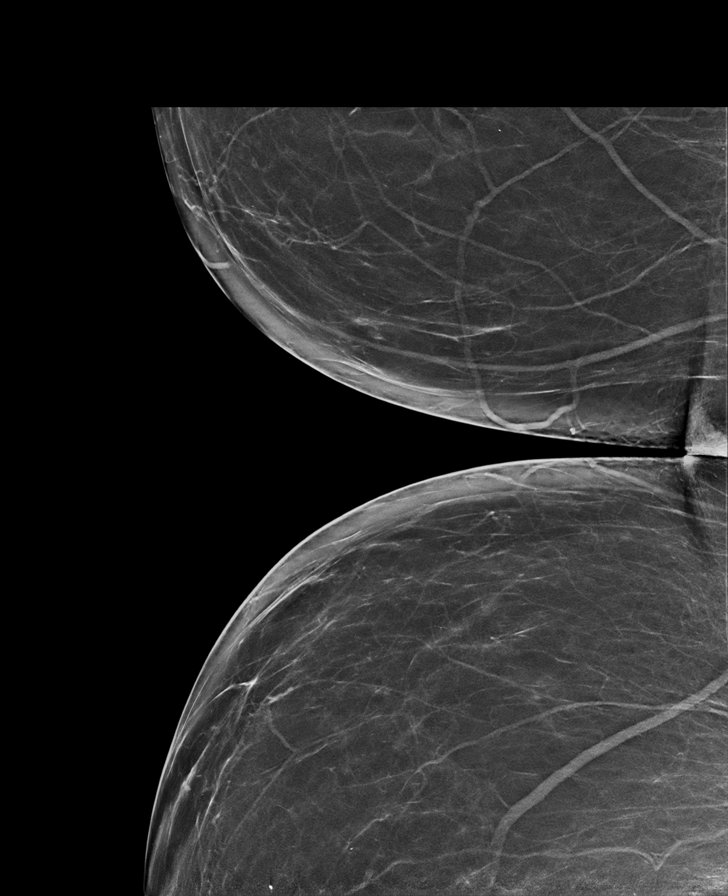

[L CC tomo · tomo slice 40/79.0]
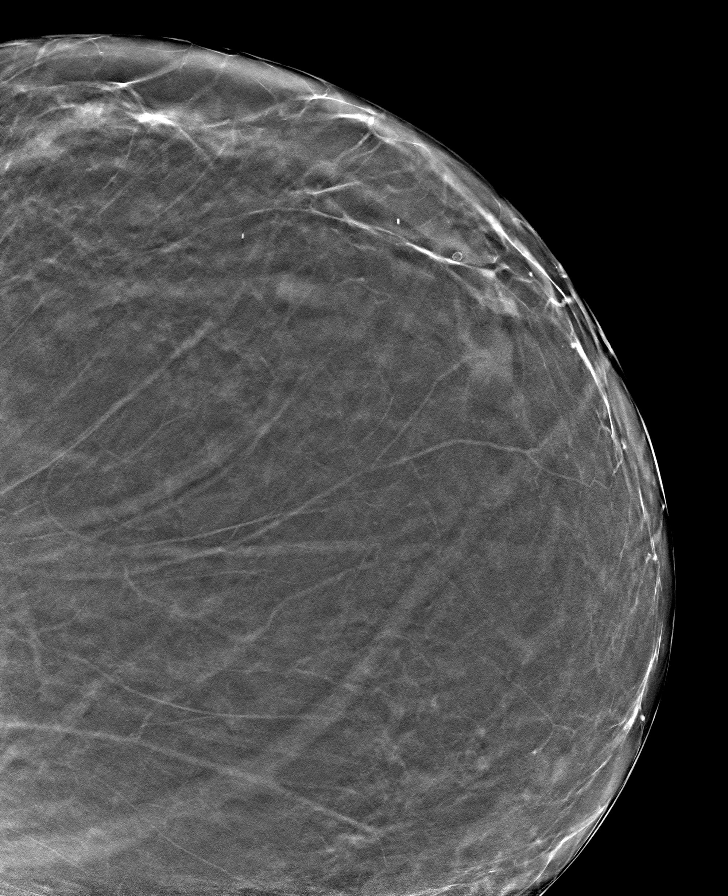

[8 of 40 positions shown; findings below may reference images not displayed]

ACR Breast Density Category b: There are scattered areas of
fibroglandular density.
FINDINGS: There are no findings suspicious for malignancy.
IMPRESSION: No mammographic evidence of malignancy. A result letter of this
screening mammogram will be mailed directly to the patient.

RECOMMENDATION:
Screening mammogram in one year. (Code:51-O-LD2)

BI-RADS CATEGORY  1: Negative.

## 2022-10-31 ENCOUNTER — Other Ambulatory Visit (HOSPITAL_COMMUNITY): Payer: Self-pay | Admitting: Family Medicine

## 2022-10-31 DIAGNOSIS — Z1231 Encounter for screening mammogram for malignant neoplasm of breast: Secondary | ICD-10-CM

## 2022-11-04 ENCOUNTER — Inpatient Hospital Stay: Admission: RE | Admit: 2022-11-04 | Payer: Self-pay | Source: Ambulatory Visit

## 2022-11-08 DIAGNOSIS — R69 Illness, unspecified: Secondary | ICD-10-CM | POA: Diagnosis not present

## 2022-11-08 DIAGNOSIS — I10 Essential (primary) hypertension: Secondary | ICD-10-CM | POA: Diagnosis not present

## 2022-11-08 DIAGNOSIS — J069 Acute upper respiratory infection, unspecified: Secondary | ICD-10-CM | POA: Diagnosis not present

## 2022-11-11 ENCOUNTER — Encounter (HOSPITAL_COMMUNITY): Payer: Self-pay

## 2022-11-11 ENCOUNTER — Ambulatory Visit (HOSPITAL_COMMUNITY): Payer: 59

## 2023-02-16 ENCOUNTER — Encounter: Payer: Self-pay | Admitting: Radiology

## 2023-06-08 DIAGNOSIS — Z6841 Body Mass Index (BMI) 40.0 and over, adult: Secondary | ICD-10-CM | POA: Diagnosis not present

## 2023-06-08 DIAGNOSIS — F4321 Adjustment disorder with depressed mood: Secondary | ICD-10-CM | POA: Diagnosis not present

## 2023-06-08 DIAGNOSIS — R1013 Epigastric pain: Secondary | ICD-10-CM | POA: Diagnosis not present

## 2023-06-08 DIAGNOSIS — I10 Essential (primary) hypertension: Secondary | ICD-10-CM | POA: Diagnosis not present

## 2023-07-06 ENCOUNTER — Ambulatory Visit: Payer: 59 | Admitting: Gastroenterology

## 2023-08-08 NOTE — Progress Notes (Unsigned)
Referring Provider:Jackson, Ames Coupe, PA-C Primary Care Physician:  Ladon Applebaum Primary Gastroenterologist:  Dr. Marletta Lor  No chief complaint on file.   HPI:   Renee Vargas is a 62 y.o. female presenting today at the request of Avis Epley, PA-C for abdominal pain.   Reviewed OV with Terie Purser, PA-C dated 06/08/23. Patient was presenting with stomach cramping x 2 weeks. Reported no known trigger. Pain had resolved by the time of OV without treatment. This was the second time it had occurred, so PCP referred to GI for further evaluation.       Colonoscopy 07/13/2017:  - One 8 mm polyp in the proximal ascending colon, removed with a hot snare. Resected and retrieved.  - Diverticulosis in the sigmoid colon and in the descending colon. - External and internal hemorrhoids. - Path with tubular adenoma.  - Recommended repeat in 5-10 years.   Past Medical History:  Diagnosis Date   Anemia    Anxiety    History of gout    Hypertension     Past Surgical History:  Procedure Laterality Date   ABDOMINAL HYSTERECTOMY N/A 03/12/2014   Procedure: SUPRACERVICAL HYSTERECTOMY;  Surgeon: Tilda Burrow, MD;  Location: [facility];  Service: Gynecology;  Laterality: N/A;   COLONOSCOPY N/A 07/13/2017   Procedure: COLONOSCOPY;  Surgeon: West Bali, MD;  Location: [facility];  Service: Endoscopy;  Laterality: N/A;   fatty mass removed Left    hip   SALPINGOOPHORECTOMY Bilateral 03/12/2014   Procedure: BILATERAL SALPINGO OOPHORECTOMY;  Surgeon: Tilda Burrow, MD;  Location: [facility];  Service: Gynecology;  Laterality: Bilateral;   TUBAL LIGATION      Current Outpatient Medications  Medication Sig Dispense Refill   amLODipine (NORVASC) 10 MG tablet Take 1 tablet by mouth daily.     buPROPion (WELLBUTRIN XL) 150 MG 24 hr tablet Take 150 mg by mouth daily.     diclofenac (VOLTAREN) 50 MG EC tablet Take 1 tablet (50 mg total) by mouth 2 (two) times daily. 60  tablet 0   escitalopram (LEXAPRO) 10 MG tablet Take 10 mg by mouth every morning.     estradiol (ESTRACE) 2 MG tablet Take 1 tablet (2 mg total) by mouth daily. 30 tablet 11   hydrochlorothiazide (HYDRODIURIL) 25 MG tablet Take 25 mg by mouth daily.     ibuprofen (ADVIL) 800 MG tablet Take 1 tablet (800 mg total) by mouth every 8 (eight) hours as needed. 90 tablet 1   meloxicam (MOBIC) 7.5 MG tablet Take 1 tablet by mouth daily as needed for severe pain.     predniSONE (DELTASONE) 50 MG tablet Take 1 tablet with breakfast daily for the next 5 days 5 tablet 0   No current facility-administered medications for this visit.    Allergies as of 08/10/2023 - Review Complete 05/29/2022  Allergen Reaction Noted   Aleve [naproxen sodium] Hives 03/05/2014    Family History  Problem Relation Age of Onset   Hypertension Mother    Hypertension Sister     Social History   Socioeconomic History   Marital status: Divorced    Spouse name: Not on file   Number of children: Not on file   Years of education: Not on file   Highest education level: Not on file  Occupational History   Not on file  Tobacco Use   Smoking status: Never   Smokeless tobacco: Never  Substance and Sexual Activity   Alcohol use: No  Drug use: No   Sexual activity: Yes    Birth control/protection: None  Other Topics Concern   Not on file  Social History Narrative   Not on file   Social Determinants of Health   Financial Resource Strain: Not on file  Food Insecurity: Not on file  Transportation Needs: Not on file  Physical Activity: Not on file  Stress: Not on file  Social Connections: Not on file  Intimate Partner Violence: Not on file    Review of Systems: Gen: Denies any fever, chills, fatigue, weight loss, lack of appetite.  CV: Denies chest pain, heart palpitations, peripheral edema, syncope.  Resp: Denies shortness of breath at rest or with exertion. Denies wheezing or cough.  GI: Denies dysphagia or  odynophagia. Denies jaundice, hematemesis, fecal incontinence. GU : Denies urinary burning, urinary frequency, urinary hesitancy MS: Denies joint pain, muscle weakness, cramps, or limitation of movement.  Derm: Denies rash, itching, dry skin Psych: Denies depression, anxiety, memory loss, and confusion Heme: Denies bruising, bleeding, and enlarged lymph nodes.  Physical Exam: LMP 02/21/2014  General:   Alert and oriented. Pleasant and cooperative. Well-nourished and well-developed.  Head:  Normocephalic and atraumatic. Eyes:  Without icterus, sclera clear and conjunctiva pink.  Ears:  Normal auditory acuity. Lungs:  Clear to auscultation bilaterally. No wheezes, rales, or rhonchi. No distress.  Heart:  S1, S2 present without murmurs appreciated.  Abdomen:  +BS, soft, non-tender and non-distended. No HSM noted. No guarding or rebound. No masses appreciated.  Rectal:  Deferred  Msk:  Symmetrical without gross deformities. Normal posture. Extremities:  Without edema. Neurologic:  Alert and  oriented x4;  grossly normal neurologically. Skin:  Intact without significant lesions or rashes. Psych:  Alert and cooperative. Normal mood and affect.    Assessment:     Plan:  ***   Ermalinda Memos, PA-C [facility] 08/10/2023

## 2023-08-10 ENCOUNTER — Other Ambulatory Visit: Payer: Self-pay | Admitting: *Deleted

## 2023-08-10 ENCOUNTER — Encounter: Payer: Self-pay | Admitting: *Deleted

## 2023-08-10 ENCOUNTER — Ambulatory Visit (INDEPENDENT_AMBULATORY_CARE_PROVIDER_SITE_OTHER): Payer: Self-pay | Admitting: Gastroenterology

## 2023-08-10 ENCOUNTER — Encounter: Payer: Self-pay | Admitting: Gastroenterology

## 2023-08-10 VITALS — BP 137/88 | HR 96 | Temp 97.6°F | Ht 66.0 in | Wt 338.4 lb

## 2023-08-10 DIAGNOSIS — R1013 Epigastric pain: Secondary | ICD-10-CM | POA: Insufficient documentation

## 2023-08-10 DIAGNOSIS — Z86010 Personal history of colonic polyps: Secondary | ICD-10-CM

## 2023-08-10 MED ORDER — PEG 3350-KCL-NA BICARB-NACL 420 G PO SOLR: 4000.0000 mL | Freq: Once | ORAL | 0 refills | Status: AC

## 2023-08-10 MED ORDER — PANTOPRAZOLE SODIUM 40 MG PO TBEC: 40.0000 mg | DELAYED_RELEASE_TABLET | Freq: Every day | ORAL | 3 refills | Status: DC

## 2023-08-10 NOTE — Patient Instructions (Addendum)
Start pantoprazole 40 mg daily first thing in the morning, at least 30 minutes before breakfast.  We will schedule you for an upper endoscopy and colonoscopy with Dr. Marletta Lor in the near future.  Limit ibuprofen is much as possible.  Try to use Tylenol first for pain.  Do not take more than 3000 mg of Tylenol per 24 hours.  If you have any worsening/persistent abdominal pain between now and the time of your procedure, please let me know.  I will follow-up with you in the office after your procedures.   It was nice to meet you today!   Ermalinda Memos, PA-C [facility]

## 2023-09-12 NOTE — Patient Instructions (Signed)
Renee Vargas  09/12/2023     @PREFPERIOPPHARMACY @   Your procedure is scheduled on  09/18/2023.   Report to [facility] at  0700 A.M.   Call this number if you have problems the morning of surgery:  [phone]  If you experience any cold or flu symptoms such as cough, fever, chills, shortness of breath, etc. between now and your scheduled surgery, please notify us at the above number.   Remember:  Follow the diet and prep instructions given to you by the office.     Take these medicines the morning of surgery with A SIP OF WATER      xanax(if needed), amlodipine, escitalopram, pantoprazole.     Do not wear jewelry, make-up or nail polish, including gel polish,  artificial nails, or any other type of covering on natural nails (fingers and  toes).  Do not wear lotions, powders, or perfumes, or deodorant.  Do not shave 48 hours prior to surgery.  Men may shave face and neck.  Do not bring valuables to the hospital.  [facility] is not responsible for any belongings or valuables.  Contacts, dentures or bridgework may not be worn into surgery.  Leave your suitcase in the car.  After surgery it may be brought to your room.  For patients admitted to the hospital, discharge time will be determined by your treatment team.  Patients discharged the day of surgery will not be allowed to drive home and must  have someone with them for 24 hours.    Special instructions:   DO NOT smoke tobacco or vape for 24 hours before your procedure.  Please read over the following fact sheets that you were given. Anesthesia Post-op Instructions and Care and Recovery After Surgery        Upper Endoscopy, Adult, Care After After the procedure, it is common to have a sore throat. It is also common to have: Mild stomach pain or discomfort. Bloating. Nausea. Follow these instructions at home: The instructions below may help you care for yourself at home. Your health care provider  may give you more instructions. If you have questions, ask your health care provider. If you were given a sedative during the procedure, it can affect you for several hours. Do not drive or operate machinery until your health care provider says that it is safe. If you will be going home right after the procedure, plan to have a responsible adult: Take you home from the hospital or clinic. You will not be allowed to drive. Care for you for the time you are told. Follow instructions from your health care provider about what you may eat and drink. Return to your normal activities as told by your health care provider. Ask your health care provider what activities are safe for you. Take over-the-counter and prescription medicines only as told by your health care provider. Contact a health care provider if you: Have a sore throat that lasts longer than one day. Have trouble swallowing. Have a fever. Get help right away if you: Vomit blood or your vomit looks like coffee grounds. Have bloody, black, or tarry stools. Have a very bad sore throat or you cannot swallow. Have difficulty breathing or very bad pain in your chest or abdomen. These symptoms may be an emergency. Get help right away. Call 911. Do not wait to see if the symptoms will go away. Do not drive yourself to the hospital. Summary After the procedure,  it is common to have a sore throat, mild stomach discomfort, bloating, and nausea. If you were given a sedative during the procedure, it can affect you for several hours. Do not drive until your health care provider says that it is safe. Follow instructions from your health care provider about what you may eat and drink. Return to your normal activities as told by your health care provider. This information is not intended to replace advice given to you by your health care provider. Make sure you discuss any questions you have with your health care provider. Document Revised: 03/16/2022  Document Reviewed: 03/16/2022 Elsevier Patient Education  2024 Elsevier [employer]. Colonoscopy, Adult, Care After The following information offers guidance on how to care for yourself after your procedure. Your health care provider may also give you more specific instructions. If you have problems or questions, contact your health care provider. What can I expect after the procedure? After the procedure, it is common to have: A small amount of blood in your stool for 24 hours after the procedure. Some gas. Mild cramping or bloating of your abdomen. Follow these instructions at home: Eating and drinking  Drink enough fluid to keep your urine pale yellow. Follow instructions from your health care provider about eating or drinking restrictions. Resume your normal diet as told by your health care provider. Avoid heavy or fried foods that are hard to digest. Activity Rest as told by your health care provider. Avoid sitting for a long time without moving. Get up to take short walks every 1-2 hours. This is important to improve blood flow and breathing. Ask for help if you feel weak or unsteady. Return to your normal activities as told by your health care provider. Ask your health care provider what activities are safe for you. Managing cramping and bloating  Try walking around when you have cramps or feel bloated. If directed, apply heat to your abdomen as told by your health care provider. Use the heat source that your health care provider recommends, such as a moist heat pack or a heating pad. Place a towel between your skin and the heat source. Leave the heat on for 20-30 minutes. Remove the heat if your skin turns bright red. This is especially important if you are unable to feel pain, heat, or cold. You have a greater risk of getting burned. General instructions If you were given a sedative during the procedure, it can affect you for several hours. Do not drive or operate machinery until your  health care provider says that it is safe. For the first 24 hours after the procedure: Do not sign important documents. Do not drink alcohol. Do your regular daily activities at a slower pace than normal. Eat soft foods that are easy to digest. Take over-the-counter and prescription medicines only as told by your health care provider. Keep all follow-up visits. This is important. Contact a health care provider if: You have blood in your stool 2-3 days after the procedure. Get help right away if: You have more than a small spotting of blood in your stool. You have large blood clots in your stool. You have swelling of your abdomen. You have nausea or vomiting. You have a fever. You have increasing pain in your abdomen that is not relieved with medicine. These symptoms may be an emergency. Get help right away. Call 911. Do not wait to see if the symptoms will go away. Do not drive yourself to the hospital. Summary After the procedure,  it is common to have a small amount of blood in your stool. You may also have mild cramping and bloating of your abdomen. If you were given a sedative during the procedure, it can affect you for several hours. Do not drive or operate machinery until your health care provider says that it is safe. Get help right away if you have a lot of blood in your stool, nausea or vomiting, a fever, or increased pain in your abdomen. This information is not intended to replace advice given to you by your health care provider. Make sure you discuss any questions you have with your health care provider. Document Revised: 01/17/2023 Document Reviewed: 07/28/2021 Elsevier Patient Education  2024 Elsevier Inc. Monitored Anesthesia Care, Care After The following information offers guidance on how to care for yourself after your procedure. Your health care provider may also give you more specific instructions. If you have problems or questions, contact your health care  provider. What can I expect after the procedure? After the procedure, it is common to have: Tiredness. Little or no memory about what happened during or after the procedure. Impaired judgment when it comes to making decisions. Nausea or vomiting. Some trouble with balance. Follow these instructions at home: For the time period you were told by your health care provider:  Rest. Do not participate in activities where you could fall or become injured. Do not drive or use machinery. Do not drink alcohol. Do not take sleeping pills or medicines that cause drowsiness. Do not make important decisions or sign legal documents. Do not take care of children on your own. Medicines Take over-the-counter and prescription medicines only as told by your health care provider. If you were prescribed antibiotics, take them as told by your health care provider. Do not stop using the antibiotic even if you start to feel better. Eating and drinking Follow instructions from your health care provider about what you may eat and drink. Drink enough fluid to keep your urine pale yellow. If you vomit: Drink clear fluids slowly and in small amounts as you are able. Clear fluids include water, ice chips, low-calorie sports drinks, and fruit juice that has water added to it (diluted fruit juice). Eat light and bland foods in small amounts as you are able. These foods include bananas, applesauce, rice, lean meats, toast, and crackers. General instructions  Have a responsible adult stay with you for the time you are told. It is important to have someone help care for you until you are awake and alert. If you have sleep apnea, surgery and some medicines can increase your risk for breathing problems. Follow instructions from your health care provider about wearing your sleep device: When you are sleeping. This includes during daytime naps. While taking prescription pain medicines, sleeping medicines, or medicines that  make you drowsy. Do not use any products that contain nicotine or tobacco. These products include cigarettes, chewing tobacco, and vaping devices, such as e-cigarettes. If you need help quitting, ask your health care provider. Contact a health care provider if: You feel nauseous or vomit every time you eat or drink. You feel light-headed. You are still sleepy or having trouble with balance after 24 hours. You get a rash. You have a fever. You have redness or swelling around the IV site. Get help right away if: You have trouble breathing. You have new confusion after you get home. These symptoms may be an emergency. Get help right away. Call 911. Do not wait to  see if the symptoms will go away. Do not drive yourself to the hospital. This information is not intended to replace advice given to you by your health care provider. Make sure you discuss any questions you have with your health care provider. Document Revised: 05/02/2022 Document Reviewed: 05/02/2022 Elsevier Patient Education  2024 [employer].

## 2023-09-14 ENCOUNTER — Other Ambulatory Visit: Payer: Self-pay

## 2023-09-14 ENCOUNTER — Other Ambulatory Visit (HOSPITAL_COMMUNITY): Payer: Self-pay

## 2023-09-14 ENCOUNTER — Encounter (HOSPITAL_COMMUNITY)
Admission: RE | Admit: 2023-09-14 | Discharge: 2023-09-14 | Disposition: A | Discharge status: Home / Self Care | Payer: 59 | Source: Ambulatory Visit | Attending: Internal Medicine | Admitting: Internal Medicine

## 2023-09-14 ENCOUNTER — Encounter (HOSPITAL_COMMUNITY): Payer: Self-pay

## 2023-09-14 VITALS — BP 169/73 | HR 77 | Temp 97.8°F | Resp 18 | Ht 66.0 in | Wt 338.4 lb

## 2023-09-14 DIAGNOSIS — R9431 Abnormal electrocardiogram [ECG] [EKG]: Secondary | ICD-10-CM | POA: Diagnosis not present

## 2023-09-14 DIAGNOSIS — Z79899 Other long term (current) drug therapy: Secondary | ICD-10-CM | POA: Insufficient documentation

## 2023-09-14 DIAGNOSIS — Z01812 Encounter for preprocedural laboratory examination: Secondary | ICD-10-CM | POA: Diagnosis not present

## 2023-09-14 DIAGNOSIS — Z01818 Encounter for other preprocedural examination: Secondary | ICD-10-CM | POA: Diagnosis present

## 2023-09-14 DIAGNOSIS — Z01810 Encounter for preprocedural cardiovascular examination: Secondary | ICD-10-CM | POA: Diagnosis not present

## 2023-09-14 DIAGNOSIS — I10 Essential (primary) hypertension: Secondary | ICD-10-CM | POA: Diagnosis not present

## 2023-09-14 HISTORY — DX: Gastro-esophageal reflux disease without esophagitis: K21.9

## 2023-09-14 LAB — BASIC METABOLIC PANEL
Anion gap: 7 (ref 5–15)
BUN: 15 mg/dL (ref 6–20)
CO2: 27 mmol/L (ref 22–32)
Calcium: 8.4 mg/dL — ABNORMAL LOW (ref 8.9–10.3)
Chloride: 102 mmol/L (ref 98–111)
Creatinine, Ser: 0.78 mg/dL (ref 0.44–1.00)
GFR, Estimated: >60 mL/min (ref >60)
Glucose, Bld: 89 mg/dL (ref 70–99)
Potassium: 3.2 mmol/L — ABNORMAL LOW (ref 3.5–5.1)
Sodium: 136 mmol/L (ref 135–145)

## 2023-09-15 ENCOUNTER — Telehealth: Payer: Self-pay | Admitting: Internal Medicine

## 2023-09-15 MED ORDER — POTASSIUM CHLORIDE CRYS ER 20 MEQ PO TBCR: 20.0000 meq | EXTENDED_RELEASE_TABLET | Freq: Two times a day (BID) | ORAL | 0 refills | Status: DC

## 2023-09-15 NOTE — Telephone Encounter (Signed)
Patient scheduled for EGD/CLN on Monday, pre procedure labs with potassium 3.2.  I called patient and discussed this with her.  Will send in potassium chloride to take twice daily x 3 days. Patient is agreeable.

## 2023-09-18 ENCOUNTER — Ambulatory Visit (HOSPITAL_COMMUNITY): Payer: 59 | Admitting: Anesthesiology

## 2023-09-18 ENCOUNTER — Encounter (HOSPITAL_COMMUNITY)
Admission: RE | Disposition: A | Discharge status: Home / Self Care | Payer: Self-pay | Source: Home / Self Care | Attending: Internal Medicine

## 2023-09-18 ENCOUNTER — Encounter (HOSPITAL_COMMUNITY): Payer: Self-pay

## 2023-09-18 ENCOUNTER — Ambulatory Visit (HOSPITAL_COMMUNITY)
Admission: RE | Admit: 2023-09-18 | Discharge: 2023-09-18 | Disposition: A | Discharge status: Home / Self Care | Payer: 59 | Attending: Internal Medicine | Admitting: Internal Medicine

## 2023-09-18 DIAGNOSIS — K648 Other hemorrhoids: Secondary | ICD-10-CM | POA: Insufficient documentation

## 2023-09-18 DIAGNOSIS — K219 Gastro-esophageal reflux disease without esophagitis: Secondary | ICD-10-CM | POA: Insufficient documentation

## 2023-09-18 DIAGNOSIS — K5730 Diverticulosis of large intestine without perforation or abscess without bleeding: Secondary | ICD-10-CM | POA: Insufficient documentation

## 2023-09-18 DIAGNOSIS — R1031 Right lower quadrant pain: Secondary | ICD-10-CM

## 2023-09-18 DIAGNOSIS — D120 Benign neoplasm of cecum: Secondary | ICD-10-CM | POA: Diagnosis not present

## 2023-09-18 DIAGNOSIS — Z1211 Encounter for screening for malignant neoplasm of colon: Secondary | ICD-10-CM | POA: Diagnosis present

## 2023-09-18 DIAGNOSIS — D1779 Benign lipomatous neoplasm of other sites: Secondary | ICD-10-CM | POA: Insufficient documentation

## 2023-09-18 DIAGNOSIS — D126 Benign neoplasm of colon, unspecified: Secondary | ICD-10-CM | POA: Diagnosis not present

## 2023-09-18 DIAGNOSIS — I10 Essential (primary) hypertension: Secondary | ICD-10-CM | POA: Insufficient documentation

## 2023-09-18 DIAGNOSIS — K2970 Gastritis, unspecified, without bleeding: Secondary | ICD-10-CM | POA: Insufficient documentation

## 2023-09-18 DIAGNOSIS — Z86010 Personal history of colonic polyps: Secondary | ICD-10-CM | POA: Insufficient documentation

## 2023-09-18 DIAGNOSIS — K2950 Unspecified chronic gastritis without bleeding: Secondary | ICD-10-CM

## 2023-09-18 HISTORY — PX: ESOPHAGOGASTRODUODENOSCOPY (EGD) WITH PROPOFOL: SHX5813

## 2023-09-18 HISTORY — PX: POLYPECTOMY: SHX5525

## 2023-09-18 HISTORY — PX: COLONOSCOPY WITH PROPOFOL: SHX5780

## 2023-09-18 HISTORY — PX: BIOPSY: SHX5522

## 2023-09-18 SURGERY — COLONOSCOPY WITH PROPOFOL
Anesthesia: General

## 2023-09-18 MED ORDER — LACTATED RINGERS IV SOLN: INTRAVENOUS | Status: DC

## 2023-09-18 MED ORDER — PROPOFOL 10 MG/ML IV BOLUS
INTRAVENOUS | Status: DC | PRN
Start: 2023-09-18 — End: 2023-09-18
  Administered 2023-09-18: 100 mg via INTRAVENOUS

## 2023-09-18 MED ORDER — LIDOCAINE HCL (CARDIAC) PF 100 MG/5ML IV SOSY
PREFILLED_SYRINGE | INTRAVENOUS | Status: DC | PRN
  Administered 2023-09-18: 60 mg via INTRAVENOUS

## 2023-09-18 MED ORDER — GLYCOPYRROLATE PF 0.2 MG/ML IJ SOSY
PREFILLED_SYRINGE | INTRAMUSCULAR | Status: DC | PRN
  Administered 2023-09-18: .1 mg via INTRAVENOUS

## 2023-09-18 MED ORDER — PROPOFOL 500 MG/50ML IV EMUL
INTRAVENOUS | Status: DC | PRN
  Administered 2023-09-18: 150 ug/kg/min via INTRAVENOUS

## 2023-09-18 MED ORDER — STERILE WATER FOR IRRIGATION IR SOLN
Status: DC | PRN
  Administered 2023-09-18: 100 mL

## 2023-09-18 NOTE — H&P (Signed)
Primary Care Physician:  Avis Epley, PA-C Primary Gastroenterologist:  Dr. Marletta Lor  Pre-Procedure History & Physical: HPI:  Renee Vargas is a 62 y.o. female is here for an upper endoscopy for epigastric pain and a colonoscopy to be performed for surveillance purposes, personal history of adenomatous colon polyps in 2018  Past Medical History:  Diagnosis Date   Anemia    Anxiety    GERD (gastroesophageal reflux disease)    History of gout    Hypertension     Past Surgical History:  Procedure Laterality Date   ABDOMINAL HYSTERECTOMY N/A 03/12/2014   Procedure: SUPRACERVICAL HYSTERECTOMY;  Surgeon: Tilda Burrow, MD;  Location: [facility];  Service: Gynecology;  Laterality: N/A;   COLONOSCOPY N/A 07/13/2017   Surgeon: West Bali, MD; 8 mm tubular adenoma, sigmoid and descending colon diverticulosis, external and internal hemorrhoids.  Recommended repeat in 5-10 years.   fatty mass removed Left    hip   SALPINGOOPHORECTOMY Bilateral 03/12/2014   Procedure: BILATERAL SALPINGO OOPHORECTOMY;  Surgeon: Tilda Burrow, MD;  Location: [facility];  Service: Gynecology;  Laterality: Bilateral;   TUBAL LIGATION      Prior to Admission medications   Medication Sig Start Date End Date Taking? Authorizing Provider  amLODipine (NORVASC) 10 MG tablet Take 1 tablet by mouth daily. 06/24/17  Yes [provider]  escitalopram (LEXAPRO) 10 MG tablet Take 10 mg by mouth every morning. 08/06/20  Yes [provider]  hydrochlorothiazide (HYDRODIURIL) 25 MG tablet Take 25 mg by mouth daily.   Yes [provider]  pantoprazole (PROTONIX) 40 MG tablet Take 1 tablet (40 mg total) by mouth daily. 08/10/23  Yes Ermalinda Memos S, PA-C  potassium chloride SA (KLOR-CON M) 20 MEQ tablet Take 1 tablet (20 mEq total) by mouth 2 (two) times daily for 3 days. 09/15/23 09/18/23 Yes Myrtle Barnhard, Hennie Duos, DO  ALPRAZolam Prudy Feeler) 0.5 MG tablet Take 0.5 mg by mouth daily. 08/09/23   [provider]  ibuprofen (ADVIL) 800 MG tablet Take 1 tablet (800 mg total) by mouth every 8 (eight) hours as needed. 01/07/22   Oliver Barre, MD    Allergies as of 08/10/2023 - Review Complete 08/10/2023  Allergen Reaction Noted   Aleve [naproxen sodium] Hives 03/05/2014    Family History  Problem Relation Age of Onset   Hypertension Mother    Hypertension Sister    Colon cancer Neg Hx    Gastric cancer Neg Hx     Social History   Socioeconomic History   Marital status: Divorced    Spouse name: Not on file   Number of children: Not on file   Years of education: Not on file   Highest education level: Not on file  Occupational History   Not on file  Tobacco Use   Smoking status: Never   Smokeless tobacco: Never  Vaping Use   Vaping status: Never Used  Substance and Sexual Activity   Alcohol use: No   Drug use: No   Sexual activity: Yes    Birth control/protection: None  Other Topics Concern   Not on file  Social History Narrative   Not on file   Social Determinants of Health   Financial Resource Strain: Not on file  Food Insecurity: Not on file  Transportation Needs: Not on file  Physical Activity: Not on file  Stress: Not on file  Social Connections: Not on file  Intimate Partner Violence: Not on file    Review  of Systems: See HPI, otherwise negative ROS  Physical Exam: Vital signs in last 24 hours: Temp:  [98.3 F (36.8 C)] 98.3 F (36.8 C) (09/30 0759) Pulse Rate:  [56] 56 (09/30 0759) Resp:  [16] 16 (09/30 0759) BP: (157)/(84) 157/84 (09/30 0759) SpO2:  [100 %] 100 % (09/30 0759) Weight:  [153.5 kg] 153.5 kg (09/30 0815)   General:   Alert,  Well-developed, well-nourished, pleasant and cooperative in NAD Head:  Normocephalic and atraumatic. Eyes:  Sclera clear, no icterus.   Conjunctiva pink. Ears:  Normal auditory acuity. Nose:  No deformity, discharge,  or lesions. Msk:  Symmetrical without gross deformities. Normal posture. Extremities:   Without clubbing or edema. Neurologic:  Alert and  oriented x4;  grossly normal neurologically. Skin:  Intact without significant lesions or rashes. Psych:  Alert and cooperative. Normal mood and affect.  Impression/Plan: Renee Vargas is here for an upper endoscopy for epigastric pain and a colonoscopy to be performed for surveillance purposes, personal history of adenomatous colon polyps in 2018  The risks of the procedure including infection, bleed, or perforation as well as benefits, limitations, alternatives and imponderables have been reviewed with the patient. Questions have been answered. All parties agreeable.

## 2023-09-18 NOTE — Anesthesia Postprocedure Evaluation (Signed)
Anesthesia Post Note  Patient: Renee Vargas  Procedure(s) Performed: COLONOSCOPY WITH PROPOFOL ESOPHAGOGASTRODUODENOSCOPY (EGD) WITH PROPOFOL BIOPSY POLYPECTOMY  Patient location during evaluation: Phase II Anesthesia Type: General Level of consciousness: awake Pain management: pain level controlled Vital Signs Assessment: post-procedure vital signs reviewed and stable Respiratory status: spontaneous breathing and respiratory function stable Cardiovascular status: blood pressure returned to baseline and stable Postop Assessment: no headache and no apparent nausea or vomiting Anesthetic complications: no Comments: Late entry   No notable events documented.   Last Vitals:  Vitals:   09/18/23 0759 09/18/23 0927  BP: (!) 157/84 125/79  Pulse: (!) 56 78  Resp: 16 20  Temp: 36.8 C (!) 36.1 C  SpO2: 100% 100%    Last Pain:  Vitals:   09/18/23 0927  TempSrc: Axillary  PainSc: 0-No pain                 Windell Norfolk

## 2023-09-18 NOTE — Anesthesia Preprocedure Evaluation (Signed)
Anesthesia Evaluation  Patient identified by MRN, date of birth, ID band Patient awake    Reviewed: Allergy & Precautions, H&P , NPO status , Patient's Chart, lab work & pertinent test results, reviewed documented beta blocker date and time   Airway Mallampati: III  TM Distance: >3 FB Neck ROM: Full  Mouth opening: Limited Mouth Opening  Dental no notable dental hx. (+) Teeth Intact   Pulmonary neg pulmonary ROS   Pulmonary exam normal breath sounds clear to auscultation       Cardiovascular Exercise Tolerance: Good hypertension, Pt. on medications negative cardio ROS  Rhythm:Regular Rate:Normal     Neuro/Psych  PSYCHIATRIC DISORDERS Anxiety     negative neurological ROS  negative psych ROS   GI/Hepatic negative GI ROS, Neg liver ROS,,,  Endo/Other  negative endocrine ROS    Renal/GU negative Renal ROS  negative genitourinary   Musculoskeletal   Abdominal  (+) + obese  Peds  Hematology negative hematology ROS (+) Blood dyscrasia, anemia   Anesthesia Other Findings   Reproductive/Obstetrics negative OB ROS                              Anesthesia Physical Anesthesia Plan  ASA: 3  Anesthesia Plan: General   Post-op Pain Management: Minimal or no pain anticipated   Induction: Intravenous  PONV Risk Score and Plan: 0 and TIVA  Airway Management Planned: Nasal Cannula  Additional Equipment:   Intra-op Plan:   Post-operative Plan:   Informed Consent: I have reviewed the patients History and Physical, chart, labs and discussed the procedure including the risks, benefits and alternatives for the proposed anesthesia with the patient or authorized representative who has indicated his/her understanding and acceptance.     Dental Advisory Given  Plan Discussed with: CRNA  Anesthesia Plan Comments:          Anesthesia Quick Evaluation

## 2023-09-18 NOTE — Discharge Instructions (Signed)
EGD Discharge instructions Please read the instructions outlined below and refer to this sheet in the next few weeks. These discharge instructions provide you with general information on caring for yourself after you leave the hospital. Your doctor may also give you specific instructions. While your treatment has been planned according to the most current medical practices available, unavoidable complications occasionally occur. If you have any problems or questions after discharge, please call your doctor. ACTIVITY You may resume your regular activity but move at a slower pace for the next 24 hours.  Take frequent rest periods for the next 24 hours.  Walking will help expel (get rid of) the air and reduce the bloated feeling in your abdomen.  No driving for 24 hours (because of the anesthesia (medicine) used during the test).  You may shower.  Do not sign any important legal documents or operate any machinery for 24 hours (because of the anesthesia used during the test).  NUTRITION Drink plenty of fluids.  You may resume your normal diet.  Begin with a light meal and progress to your normal diet.  Avoid alcoholic beverages for 24 hours or as instructed by your caregiver.  MEDICATIONS You may resume your normal medications unless your caregiver tells you otherwise.  WHAT YOU CAN EXPECT TODAY You may experience abdominal discomfort such as a feeling of fullness or "gas" pains.  FOLLOW-UP Your doctor will discuss the results of your test with you.  SEEK IMMEDIATE MEDICAL ATTENTION IF ANY OF THE FOLLOWING OCCUR: Excessive nausea (feeling sick to your stomach) and/or vomiting.  Severe abdominal pain and distention (swelling).  Trouble swallowing.  Temperature over 101 F (37.8 C).  Rectal bleeding or vomiting of blood.     Colonoscopy Discharge Instructions  Read the instructions outlined below and refer to this sheet in the next few weeks. These discharge instructions provide you with  general information on caring for yourself after you leave the hospital. Your doctor may also give you specific instructions. While your treatment has been planned according to the most current medical practices available, unavoidable complications occasionally occur.   ACTIVITY You may resume your regular activity, but move at a slower pace for the next 24 hours.  Take frequent rest periods for the next 24 hours.  Walking will help get rid of the air and reduce the bloated feeling in your belly (abdomen).  No driving for 24 hours (because of the medicine (anesthesia) used during the test).   Do not sign any important legal documents or operate any machinery for 24 hours (because of the anesthesia used during the test).  NUTRITION Drink plenty of fluids.  You may resume your normal diet as instructed by your doctor.  Begin with a light meal and progress to your normal diet. Heavy or fried foods are harder to digest and may make you feel sick to your stomach (nauseated).  Avoid alcoholic beverages for 24 hours or as instructed.  MEDICATIONS You may resume your normal medications unless your doctor tells you otherwise.  WHAT YOU CAN EXPECT TODAY Some feelings of bloating in the abdomen.  Passage of more gas than usual.  Spotting of blood in your stool or on the toilet paper.  IF YOU HAD POLYPS REMOVED DURING THE COLONOSCOPY: No aspirin products for 7 days or as instructed.  No alcohol for 7 days or as instructed.  Eat a soft diet for the next 24 hours.  FINDING OUT THE RESULTS OF YOUR TEST Not all test results are  available during your visit. If your test results are not back during the visit, make an appointment with your caregiver to find out the results. Do not assume everything is normal if you have not heard from your caregiver or the medical facility. It is important for you to follow up on all of your test results.  SEEK IMMEDIATE MEDICAL ATTENTION IF: You have more than a spotting of  blood in your stool.  Your belly is swollen (abdominal distention).  You are nauseated or vomiting.  You have a temperature over 101.  You have abdominal pain or discomfort that is severe or gets worse throughout the day.   Your EGD revealed mild amount inflammation in your stomach.  I took biopsies of this to rule out infection with a bacteria called H. pylori.  Await pathology results, my office will contact you.  Continue on pantoprazole daily.   Esophagus and small bowel were normal in appearance.   Your colonoscopy revealed 1 polyp(s) which I removed successfully. Await pathology results, my office will contact you. I recommend repeating colonoscopy in 5 years for surveillance purposes.   You also have diverticulosis and internal hemorrhoids. I would recommend increasing fiber in your diet or adding OTC Benefiber/Metamucil. Be sure to drink at least 4 to 6 glasses of water daily. Follow-up with GI in 3 months   I hope you have a great rest of your week!  Hennie Duos. Marletta Lor, D.O. Gastroenterology and Hepatology [facility]

## 2023-09-18 NOTE — Transfer of Care (Signed)
Immediate Anesthesia Transfer of Care Note  Patient: Renee Vargas  Procedure(s) Performed: COLONOSCOPY WITH PROPOFOL ESOPHAGOGASTRODUODENOSCOPY (EGD) WITH PROPOFOL BIOPSY POLYPECTOMY  Patient Location: Short Stay  Anesthesia Type:General  Level of Consciousness: awake  Airway & Oxygen Therapy: Patient Spontanous Breathing  Post-op Assessment: Report given to RN and Post -op Vital signs reviewed and stable  Post vital signs: Reviewed and stable  Last Vitals:  Vitals Value Taken Time  BP 125/79 09/18/23 0926  Temp    Pulse 78 09/18/23 0928  Resp 40 09/18/23 0928  SpO2 98 % 09/18/23 0928  Vitals shown include unfiled device data.  Last Pain:  Vitals:   09/18/23 0900  PainSc: 0-No pain         Complications: No notable events documented.

## 2023-09-18 NOTE — Op Note (Signed)
[facility] Patient Name: Renee Vargas Procedure Date: 09/18/2023 8:52 AM MRN: [id number] Date of Birth: [birth year] Attending MD: Hennie Duos. Marletta Lor , [place], [id number] CSN: [id number] Age: 62 Admit Type: Outpatient Procedure:                Upper GI endoscopy Indications:              Epigastric abdominal pain Providers:                Hennie Duos. Marletta Lor, DO, Buel Ream. Thomasena Edis RN, RN,                            Dyann Ruddle Referring MD:              Medicines:                See the Anesthesia note for documentation of the                            administered medications Complications:            No immediate complications. Estimated Blood Loss:     Estimated blood loss was minimal. Procedure:                Pre-Anesthesia Assessment:                           - The anesthesia plan was to use monitored                            anesthesia care (MAC).                           After obtaining informed consent, the endoscope was                            passed under direct vision. Throughout the                            procedure, the patient's blood pressure, pulse, and                            oxygen saturations were monitored continuously. The                            [id number]-H190 ([id number]) scope was introduced through the                            mouth, and advanced to the second part of duodenum.                            The upper GI endoscopy was accomplished without                            difficulty. The patient tolerated the procedure                            well. Scope In:  9:01:27 AM Scope Out: 9:03:51 AM Total Procedure Duration: 0 hours 2 minutes 24 seconds  Findings:      The Z-line was regular and was found 36 cm from the incisors.      Localized mild inflammation characterized by erosions and erythema was       found in the gastric antrum. Biopsies were taken with a cold forceps for       Helicobacter pylori testing.      The duodenal bulb, first  portion of the duodenum and second portion of       the duodenum were normal. Impression:               - Z-line regular, 36 cm from the incisors.                           - Gastritis. Biopsied.                           - Normal duodenal bulb, first portion of the                            duodenum and second portion of the duodenum. Moderate Sedation:      Per Anesthesia Care Recommendation:           - Patient has a contact number available for                            emergencies. The signs and symptoms of potential                            delayed complications were discussed with the                            patient. Return to normal activities tomorrow.                            Written discharge instructions were provided to the                            patient.                           - Resume previous diet.                           - Continue present medications.                           - Await pathology results.                           - Use Protonix (pantoprazole) 40 mg PO daily.                            Patient reports vast improvement in epigastric pain  since starting this.                           - Return to GI clinic in 3 months. Procedure Code(s):        --- Professional ---                           [phone], Esophagogastroduodenoscopy, flexible,                            transoral; with biopsy, single or multiple Diagnosis Code(s):        --- Professional ---                           K29.70, Gastritis, unspecified, without bleeding                           R10.13, Epigastric pain CPT copyright 2022 American [employer] Association. All rights reserved. The codes documented in this report are preliminary and upon coder review may  be revised to meet current compliance requirements. Hennie Duos. Marletta Lor, DO Hennie Duos. Marletta Lor, DO 09/18/2023 9:06:24 AM This report has been signed electronically. Number of Addenda: 0

## 2023-09-18 NOTE — Op Note (Signed)
[facility] Patient Name: Renee Vargas Procedure Date: 09/18/2023 8:50 AM MRN: [id number] Date of Birth: 1961-11-21 Attending MD: Hennie Duos. Marletta Lor , [place], [id number] CSN: [id number] Age: 62 Admit Type: Outpatient Procedure:                Colonoscopy Indications:              Surveillance: Personal history of adenomatous                            polyps on last colonoscopy > 5 years ago Providers:                Hennie Duos. Marletta Lor, DO, Buel Ream. Thomasena Edis RN, RN,                            Dyann Ruddle Referring MD:              Medicines:                See the Anesthesia note for documentation of the                            administered medications Complications:            No immediate complications. Estimated Blood Loss:     Estimated blood loss was minimal. Procedure:                Pre-Anesthesia Assessment:                           - The anesthesia plan was to use monitored                            anesthesia care (MAC).                           After obtaining informed consent, the colonoscope                            was passed under direct vision. Throughout the                            procedure, the patient's blood pressure, pulse, and                            oxygen saturations were monitored continuously. The                            [id number]-HQ190L ([id number]) scope was introduced through                            the anus and advanced to the the cecum, identified                            by appendiceal orifice and ileocecal valve. The                            colonoscopy was  performed without difficulty. The                            patient tolerated the procedure well. The quality                            of the bowel preparation was evaluated using the                            BBPS [facility] Bowel Preparation Scale) with scores                            of: Right Colon = 3, Transverse Colon = 3 and Left                            Colon = 3  (entire mucosa seen well with no residual                            staining, small fragments of stool or opaque                            liquid). The total BBPS score equals 9. Scope In: 9:07:13 AM Scope Out: 9:22:28 AM Scope Withdrawal Time: 0 hours 11 minutes 47 seconds  Total Procedure Duration: 0 hours 15 minutes 15 seconds  Findings:      Non-bleeding internal hemorrhoids were found.      A few large-mouthed and medium-mouthed diverticula were found in the       sigmoid colon and descending colon.      A 3 mm polyp was found in the cecum. The polyp was sessile. The polyp       was removed with a cold snare. Resection and retrieval were complete.      There was a medium-sized lipoma, in the ascending colon. Impression:               - Non-bleeding internal hemorrhoids.                           - Diverticulosis in the sigmoid colon and in the                            descending colon.                           - One 3 mm polyp in the cecum, removed with a cold                            snare. Resected and retrieved.                           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 Moderate Sedation:      Per Anesthesia Care Recommendation:           - Patient has a contact number available for  emergencies. The signs and symptoms of potential                            delayed complications were discussed with the                            patient. Return to normal activities tomorrow.                            Written discharge instructions were provided to the                            patient.                           - Resume previous diet.                           - Continue present medications.                           - Await pathology results.                           - Repeat colonoscopy in 5 years for surveillance.                           - Return to GI clinic in 3 months. Procedure Code(s):        --- Professional ---                            [phone], Colonoscopy, flexible; with removal of                            tumor(s), polyp(s), or other lesion(s) by snare                            technique Diagnosis Code(s):        --- Professional ---                           Z86.010, Personal history of colonic polyps                           K64.8, Other hemorrhoids                           D12.0, Benign neoplasm of cecum                           K57.30, Diverticulosis of large intestine without                            perforation or abscess without bleeding CPT copyright 2022 American Medical Association. All rights reserved. The codes documented in this report are preliminary and upon coder review may  be revised to meet current compliance requirements. Hennie Duos.  Marletta Lor, DO Hennie Duos. Marletta Lor, DO 09/18/2023 9:28:36 AM This report has been signed electronically. Number of Addenda: 0

## 2023-09-19 LAB — SURGICAL PATHOLOGY

## 2023-09-25 ENCOUNTER — Encounter (HOSPITAL_COMMUNITY): Payer: Self-pay | Admitting: Internal Medicine

## 2023-09-29 ENCOUNTER — Ambulatory Visit
Admission: EM | Admit: 2023-09-29 | Discharge: 2023-09-29 | Disposition: A | Discharge status: Home / Self Care | Payer: 59 | Attending: Nurse Practitioner | Admitting: Nurse Practitioner

## 2023-09-29 ENCOUNTER — Ambulatory Visit: Payer: 59

## 2023-09-29 DIAGNOSIS — M79632 Pain in left forearm: Secondary | ICD-10-CM

## 2023-09-29 DIAGNOSIS — S5012XA Contusion of left forearm, initial encounter: Secondary | ICD-10-CM | POA: Diagnosis not present

## 2023-09-29 DIAGNOSIS — S59912A Unspecified injury of left forearm, initial encounter: Secondary | ICD-10-CM

## 2023-09-29 NOTE — Discharge Instructions (Addendum)
X-ray of the left forearm is negative for fracture or dislocation.  It is likely that you may have bruised the affected area. May take over-the-counter Tylenol or ibuprofen as needed for pain, fever, or general discomfort. Apply ice to the affected area.  Apply for 20 minutes, remove for 1 hour, repeat as needed. If symptoms are not improving over the next 2 to 3 weeks, it is recommended that you follow-up with orthopedics or with your primary care physician for further evaluation. Follow-up as needed.

## 2023-09-29 NOTE — ED Provider Notes (Signed)
[facility]    CSN: [id number] Arrival date & time: 09/29/23  1726      History   Chief Complaint No chief complaint on file.   HPI Renee Vargas is a 62 y.o. female.   The history is provided by the patient.   Patient presents for complaints of left upper extremity pain.  Patient states she was leaning on a desk at home, when the desk fell and she landed on her left forearm/elbow.  Patient states since that time, she has pain in the left forearm, states that she has difficulty bending from the elbow.  She denies numbness, tingling, bruising, swelling, or deformity.  Patient reports that the pain radiates from her elbow into the left hand.  Patient is right-hand dominant.  Past Medical History:  Diagnosis Date   Anemia    Anxiety    GERD (gastroesophageal reflux disease)    History of gout    Hypertension     Patient Active Problem List   Diagnosis Date Noted   Abdominal pain, epigastric 08/10/2023   History of adenomatous polyp of colon 08/10/2023   Special screening for malignant neoplasms, colon    Postoperative state 03/26/2014   Status post abdominal supracervical subtotal hysterectomy 03/12/2014   Fibroids, intramural 02/28/2014   Menorrhagia 02/28/2014   Fibroids 02/19/2014   Anemia 02/19/2014   Heavy menstrual bleeding 02/19/2014   Fibroid uterus 11/07/2013    Past Surgical History:  Procedure Laterality Date   ABDOMINAL HYSTERECTOMY N/A 03/12/2014   Procedure: SUPRACERVICAL HYSTERECTOMY;  Surgeon: Tilda Burrow, MD;  Location: [facility];  Service: Gynecology;  Laterality: N/A;   BIOPSY  09/18/2023   Procedure: BIOPSY;  Surgeon: Lanelle Bal, DO;  [facility]: [facility];  Service: Endoscopy;;   COLONOSCOPY N/A 07/13/2017   Surgeon: West Bali, MD; 8 mm tubular adenoma, sigmoid and descending colon diverticulosis, external and internal hemorrhoids.  Recommended repeat in 5-10 years.   COLONOSCOPY WITH PROPOFOL N/A 09/18/2023    Procedure: COLONOSCOPY WITH PROPOFOL;  Surgeon: Lanelle Bal, DO;  [facility];  Service: Endoscopy;  Laterality: N/A;  845am, asa 3   ESOPHAGOGASTRODUODENOSCOPY (EGD) WITH PROPOFOL N/A 09/18/2023   Procedure: ESOPHAGOGASTRODUODENOSCOPY (EGD) WITH PROPOFOL;  Surgeon: Lanelle Bal, DO;  [facility];  Service: Endoscopy;  Laterality: N/A;   fatty mass removed Left    hip   POLYPECTOMY  09/18/2023   Procedure: POLYPECTOMY;  Surgeon: Lanelle Bal, DO;  [facility];  Service: Endoscopy;;   SALPINGOOPHORECTOMY Bilateral 03/12/2014   Procedure: BILATERAL SALPINGO OOPHORECTOMY;  Surgeon: Tilda Burrow, MD;  Location: [facility];  Service: Gynecology;  Laterality: Bilateral;   TUBAL LIGATION      OB History   No obstetric history on file.      Home Medications    Prior to Admission medications   Medication Sig Start Date End Date Taking? Authorizing Provider  ALPRAZolam Prudy Feeler) 0.5 MG tablet Take 0.5 mg by mouth daily. 08/09/23   [provider]  amLODipine (NORVASC) 10 MG tablet Take 1 tablet by mouth daily. 06/24/17   [provider]  escitalopram (LEXAPRO) 10 MG tablet Take 10 mg by mouth every morning. 08/06/20   [provider]  hydrochlorothiazide (HYDRODIURIL) 25 MG tablet Take 25 mg by mouth daily.    [provider]  ibuprofen (ADVIL) 800 MG tablet Take 1 tablet (800 mg total) by mouth every 8 (eight) hours as needed. 01/07/22  Oliver Barre, MD  pantoprazole (PROTONIX) 40 MG tablet Take 1 tablet (40 mg total) by mouth daily. 08/10/23   Letta Median, PA-C    Family History Family History  Problem Relation Age of Onset   Hypertension Mother    Hypertension Sister    Colon cancer Neg Hx    Gastric cancer Neg Hx     Social History Social History   Tobacco Use   Smoking status: Never   Smokeless tobacco: Never  Vaping Use   Vaping status: Never Used  Substance Use Topics   Alcohol use: No    Drug use: No     Allergies   Aleve [naproxen sodium]   Review of Systems Review of Systems Per HPI  Physical Exam Triage Vital Signs ED Triage Vitals  Encounter Vitals Group     BP 09/29/23 1759 (!) 161/71     Systolic BP Percentile --      Diastolic BP Percentile --      Pulse Rate 09/29/23 1759 82     Resp 09/29/23 1759 20     Temp 09/29/23 1759 98.4 F (36.9 C)     Temp Source 09/29/23 1759 Oral     SpO2 09/29/23 1759 92 %     Weight --      Height --      Head Circumference --      Peak Flow --      Pain Score 09/29/23 1801 9     Pain Loc --      Pain Education --      Exclude from Growth Chart --    No data found.  Updated Vital Signs BP (!) 161/71 (BP Location: Right Arm)   Pulse 82   Temp 98.4 F (36.9 C) (Oral)   Resp 20   LMP 02/21/2014   SpO2 92%   Visual Acuity Right Eye Distance:   Left Eye Distance:   Bilateral Distance:    Right Eye Near:   Left Eye Near:    Bilateral Near:     Physical Exam Vitals and nursing note reviewed.  Constitutional:      General: She is not in acute distress.    Appearance: Normal appearance.  HENT:     Head: Normocephalic.  Eyes:     Extraocular Movements: Extraocular movements intact.     Pupils: Pupils are equal, round, and reactive to light.  Pulmonary:     Effort: Pulmonary effort is normal.  Musculoskeletal:     Left elbow: No swelling or deformity. Decreased range of motion (d/t pain). Tenderness present in olecranon process.     Left forearm: Tenderness (Tenderness from the proximal forearm that extends to the mid forearm in all planes) present. No swelling, edema, deformity or bony tenderness.     Cervical back: Normal range of motion.     Comments: No bruising, swelling, erythema, or obvious deformity present to the left elbow and left forearm.  Lymphadenopathy:     Cervical: No cervical adenopathy.  Skin:    General: Skin is warm and dry.  Neurological:     General: No focal deficit  present.     Mental Status: She is alert and oriented to person, place, and time.  Psychiatric:        Mood and Affect: Mood normal.        Behavior: Behavior normal.      UC Treatments / Results  Labs (all labs ordered are listed, but only abnormal results  are displayed) Labs Reviewed - No data to display  EKG   Radiology DG Forearm Left  Result Date: 09/29/2023 CLINICAL DATA:  Larey Seat onto left arm leaning on a desk EXAM: LEFT FOREARM - 2 VIEW COMPARISON:  None Available. FINDINGS: There is no evidence of fracture or other focal bone lesions. Soft tissues are unremarkable. IMPRESSION: Negative. Electronically Signed   By: Minerva Fester M.D.   On: 09/29/2023 19:49    Procedures Procedures (including critical care time)  Medications Ordered in UC Medications - No data to display  Initial Impression / Assessment and Plan / UC Course  I have reviewed the triage vital signs and the nursing notes.  Pertinent labs & imaging results that were available during my care of the patient were reviewed by me and considered in my medical decision making (see chart for details).  X-ray of the left forearm is negative for fracture or dislocation.  Do suspect a contusion of the left forearm based on the fall and mechanism of injury.  Supportive care recommendations were provided and discussed with the patient to include over-the-counter analgesics and RICE therapy.  Patient was advised to follow-up with orthopedics if symptoms do not improve over the next 2 to 3 weeks.  Patient is in agreement with this plan of care and verbalizes understanding.  All questions were answered.  Patient stable for discharge.   Final Clinical Impressions(s) / UC Diagnoses   Final diagnoses:  Contusion of left forearm, initial encounter  Left forearm pain  Injury of left forearm, initial encounter     Discharge Instructions      X-ray of the left forearm is negative for fracture or dislocation.  It is  likely that you may have bruised the affected area. May take over-the-counter Tylenol or ibuprofen as needed for pain, fever, or general discomfort. Apply ice to the affected area.  Apply for 20 minutes, remove for 1 hour, repeat as needed. If symptoms are not improving over the next 2 to 3 weeks, it is recommended that you follow-up with orthopedics or with your primary care physician for further evaluation. Follow-up as needed.     ED Prescriptions   None    PDMP not reviewed this encounter.   Abran Cantor, NP 09/29/23 2002

## 2023-09-29 NOTE — ED Triage Notes (Signed)
Pt reports she leaned on a small table and it collapsed underneath her and she injured her left arm from her elbow to her hand this morning.

## 2023-10-30 ENCOUNTER — Ambulatory Visit
Admission: RE | Admit: 2023-10-30 | Discharge: 2023-10-30 | Disposition: A | Discharge status: Home / Self Care | Payer: 59 | Source: Ambulatory Visit | Attending: Family Medicine | Admitting: Family Medicine

## 2023-10-30 VITALS — BP 181/90 | HR 92 | Temp 98.1°F | Resp 16

## 2023-10-30 DIAGNOSIS — M10371 Gout due to renal impairment, right ankle and foot: Secondary | ICD-10-CM | POA: Diagnosis not present

## 2023-10-30 MED ORDER — DEXAMETHASONE SODIUM PHOSPHATE 10 MG/ML IJ SOLN
10.0000 mg | Freq: Once | INTRAMUSCULAR | Status: AC
  Administered 2023-10-30: 10 mg via INTRAMUSCULAR

## 2023-10-30 NOTE — ED Provider Notes (Signed)
[facility]    CSN: [id number] Arrival date & time: 10/30/23  1221      History   Chief Complaint Chief Complaint  Patient presents with   Foot Pain    Gout flare up - Entered by patient    HPI Renee Vargas is a 62 y.o. female.   Patient in today with 3-day history of severe pain in the right great toe, redness, swelling, warmth.  Denies any injury, fever, chills, numbness, tingling.  History of gout and states this feels consistent.  Trying over-the-counter pain relievers with no relief.  Takes tart cherry here and there.  States she ate some shrimp recently and thinks this is what flared it up.    Past Medical History:  Diagnosis Date   Anemia    Anxiety    GERD (gastroesophageal reflux disease)    History of gout    Hypertension     Patient Active Problem List   Diagnosis Date Noted   Abdominal pain, epigastric 08/10/2023   History of adenomatous polyp of colon 08/10/2023   Special screening for malignant neoplasms, colon    Postoperative state 03/26/2014   Status post abdominal supracervical subtotal hysterectomy 03/12/2014   Fibroids, intramural 02/28/2014   Menorrhagia 02/28/2014   Fibroids 02/19/2014   Anemia 02/19/2014   Heavy menstrual bleeding 02/19/2014   Fibroid uterus 11/07/2013    Past Surgical History:  Procedure Laterality Date   ABDOMINAL HYSTERECTOMY N/A 03/12/2014   Procedure: SUPRACERVICAL HYSTERECTOMY;  Surgeon: Tilda Burrow, MD;  Location: [facility];  Service: Gynecology;  Laterality: N/A;   BIOPSY  09/18/2023   Procedure: BIOPSY;  Surgeon: Lanelle Bal, DO;  [facility];  Service: Endoscopy;;   COLONOSCOPY N/A 07/13/2017   Surgeon: West Bali, MD; 8 mm tubular adenoma, sigmoid and descending colon diverticulosis, external and internal hemorrhoids.  Recommended repeat in 5-10 years.   COLONOSCOPY WITH PROPOFOL N/A 09/18/2023   Procedure: COLONOSCOPY WITH PROPOFOL;  Surgeon: Lanelle Bal, DO;   [facility]: [facility];  Service: Endoscopy;  Laterality: N/A;  845am, asa 3   ESOPHAGOGASTRODUODENOSCOPY (EGD) WITH PROPOFOL N/A 09/18/2023   Procedure: ESOPHAGOGASTRODUODENOSCOPY (EGD) WITH PROPOFOL;  Surgeon: Lanelle Bal, DO;  [facility];  Service: Endoscopy;  Laterality: N/A;   fatty mass removed Left    hip   POLYPECTOMY  09/18/2023   Procedure: POLYPECTOMY;  Surgeon: Lanelle Bal, DO;  [facility];  Service: Endoscopy;;   SALPINGOOPHORECTOMY Bilateral 03/12/2014   Procedure: BILATERAL SALPINGO OOPHORECTOMY;  Surgeon: Tilda Burrow, MD;  Location: [facility];  Service: Gynecology;  Laterality: Bilateral;   TUBAL LIGATION      OB History   No obstetric history on file.      Home Medications    Prior to Admission medications   Medication Sig Start Date End Date Taking? Authorizing Provider  ALPRAZolam Prudy Feeler) 0.5 MG tablet Take 0.5 mg by mouth daily. 08/09/23   [provider]  amLODipine (NORVASC) 10 MG tablet Take 1 tablet by mouth daily. 06/24/17   [provider]  escitalopram (LEXAPRO) 10 MG tablet Take 10 mg by mouth every morning. 08/06/20   [provider]  hydrochlorothiazide (HYDRODIURIL) 25 MG tablet Take 25 mg by mouth daily.    [provider]  ibuprofen (ADVIL) 800 MG tablet Take 1 tablet (800 mg total) by mouth every 8 (eight) hours as needed. 01/07/22   Oliver Barre, MD  pantoprazole (PROTONIX) 40  MG tablet Take 1 tablet (40 mg total) by mouth daily. 08/10/23   Letta Median, PA-C    Family History Family History  Problem Relation Age of Onset   Hypertension Mother    Hypertension Sister    Colon cancer Neg Hx    Gastric cancer Neg Hx     Social History Social History   Tobacco Use   Smoking status: Never   Smokeless tobacco: Never  Vaping Use   Vaping status: Never Used  Substance Use Topics   Alcohol use: No   Drug use: No     Allergies   Aleve [naproxen sodium]   Review  of Systems Review of Systems Per HPI  Physical Exam Triage Vital Signs ED Triage Vitals  Encounter Vitals Group     BP 10/30/23 1238 (!) 181/90     Systolic BP Percentile --      Diastolic BP Percentile --      Pulse Rate 10/30/23 1238 92     Resp 10/30/23 1238 16     Temp 10/30/23 1238 98.1 F (36.7 C)     Temp Source 10/30/23 1238 Oral     SpO2 10/30/23 1238 96 %     Weight --      Height --      Head Circumference --      Peak Flow --      Pain Score 10/30/23 1242 10     Pain Loc --      Pain Education --      Exclude from Growth Chart --    No data found.  Updated Vital Signs BP (!) 181/90 (BP Location: Right Arm)   Pulse 92   Temp 98.1 F (36.7 C) (Oral)   Resp 16   LMP 02/21/2014   SpO2 96%   Visual Acuity Right Eye Distance:   Left Eye Distance:   Bilateral Distance:    Right Eye Near:   Left Eye Near:    Bilateral Near:     Physical Exam Vitals and nursing note reviewed.  Constitutional:      Appearance: Normal appearance. She is not ill-appearing.  HENT:     Head: Atraumatic.  Eyes:     Extraocular Movements: Extraocular movements intact.     Conjunctiva/sclera: Conjunctivae normal.  Cardiovascular:     Rate and Rhythm: Normal rate and regular rhythm.     Heart sounds: Normal heart sounds.  Pulmonary:     Effort: Pulmonary effort is normal.     Breath sounds: Normal breath sounds.  Musculoskeletal:        General: Swelling and tenderness present. No deformity or signs of injury. Normal range of motion.     Cervical back: Normal range of motion and neck supple.     Comments: Base of right great toe erythematous, edematous, significantly tender to palpation including light touch  Skin:    General: Skin is warm and dry.     Findings: Erythema present.  Neurological:     Mental Status: She is alert and oriented to person, place, and time.     Motor: No weakness.     Gait: Gait normal.     Comments: Right lower extremity neurovascularly  intact  Psychiatric:        Mood and Affect: Mood normal.        Thought Content: Thought content normal.        Judgment: Judgment normal.    UC Treatments / Results  Labs (all  labs ordered are listed, but only abnormal results are displayed) Labs Reviewed - No data to display  EKG   Radiology No results found.  Procedures Procedures (including critical care time)  Medications Ordered in UC Medications  dexamethasone (DECADRON) injection 10 mg (10 mg Intramuscular Given 10/30/23 1301)    Initial Impression / Assessment and Plan / UC Course  I have reviewed the triage vital signs and the nursing notes.  Pertinent labs & imaging results that were available during my care of the patient were reviewed by me and considered in my medical decision making (see chart for details).     Treat gout flare with IM Decadron, tart cherry supplements, Epsom salt soaks, elevation, over-the-counter pain relievers as needed.  Discussed dietary restrictions and supportive home care.  Return for worsening symptoms.  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   Particia Nearing, [place] 10/30/23 1305

## 2023-10-30 NOTE — ED Triage Notes (Signed)
Pt c/o gout flare up in the right big toe. Swelling and redness is present  x 3 days.

## 2023-11-06 ENCOUNTER — Other Ambulatory Visit: Payer: Self-pay | Admitting: Gastroenterology

## 2023-11-06 ENCOUNTER — Telehealth: Payer: Self-pay

## 2023-11-06 DIAGNOSIS — R1013 Epigastric pain: Secondary | ICD-10-CM

## 2023-11-06 MED ORDER — PANTOPRAZOLE SODIUM 40 MG PO TBEC: 40.0000 mg | DELAYED_RELEASE_TABLET | Freq: Every day | ORAL | 5 refills | Status: AC

## 2023-11-06 NOTE — Telephone Encounter (Signed)
Rx sent 

## 2023-11-06 NOTE — Progress Notes (Unsigned)
Referring Provider: Avis Epley, PA* Primary Care Physician:  Avis Epley, PA-C Primary GI Physician: Dr. Marletta Lor  Chief Complaint  Patient presents with   Abdominal Pain    Pain in the middle of stomach.     HPI:   Renee Vargas is a 62 y.o. female with history of gout, arthritis, anxiety, HTN, adenomatous colon polyp, presenting today for follow-up of epigastric abdominal pain.  Last seen in the office 08/10/2023 for further evaluation of epigastric abdominal pain which has been intermittent since March 2024.  Single episode of nausea/vomiting when symptoms initially started.  Denied association with meals.  Taking ibuprofen as needed- twice a week. 800 mg tablet. Had mild TTP in the epigastric area on exam.  Recommended starting pantoprazole 40 mg daily, avoiding NSAIDs as much as possible, and proceeding with EGD for further evaluation.  She was also scheduled for colonoscopy for surveillance purposes.  She had no significant lower GI symptoms.  Colonoscopy 09/18/2023:  Nonbleeding internal hemorrhoids, diverticulosis in the sigmoid and descending colon, 3 mm polyp in the cecum resected and retrieved, medium sized lipoma in the ascending colon.  Pathology with diminutive tubular adenoma.  Recommended repeat colonoscopy in 5 years.  EGD 09/18/2023:  Gastritis biopsied, otherwise normal exam.  Recommended continuing Protonix 40 mg daily.  Patient reported vast improvement in epigastric pain since starting pantoprazole.  Pathology with mild nonspecific reactive gastropathy, negative for H. pylori.   Today:  Mid October, started having pain in the upper abdomen, primarily just above the umbilicus. Severe. Can't get comfortable when this occurs.  Cannot lay down.  Associated nausea and vomiting. Symptoms will last all day. Had to miss 3 days of work. Last day of serious pain was last Tuesday, but continues with mild symptoms.  More severe symptoms occurring at least once a  week.  States when the pain is severe, she feels like there is a knot that comes up above her bellybutton.  Can't identify any trigger.  The days that it happens, she may feel a little off when waking in the morning and then pain starts.  Pain will will wean a little but is essentially unrelenting all day.   No reflux, never has had any trouble with this. Still taking her pantoprazole anyway.   No NSAIDs.  Stopped ibuprofen after her last visit in August.  Bowels are moving well.      Past Medical History:  Diagnosis Date   Anemia    Anxiety    GERD (gastroesophageal reflux disease)    History of gout    Hypertension     Past Surgical History:  Procedure Laterality Date   ABDOMINAL HYSTERECTOMY N/A 03/12/2014   Procedure: SUPRACERVICAL HYSTERECTOMY;  Surgeon: Tilda Burrow, MD;  Location: [facility];  Service: Gynecology;  Laterality: N/A;   BIOPSY  09/18/2023   Procedure: BIOPSY;  Surgeon: Lanelle Bal, DO;  [facility];  Service: Endoscopy;;   COLONOSCOPY N/A 07/13/2017   Surgeon: West Bali, MD; 8 mm tubular adenoma, sigmoid and descending colon diverticulosis, external and internal hemorrhoids.  Recommended repeat in 5-10 years.   COLONOSCOPY WITH PROPOFOL N/A 09/18/2023   Procedure: COLONOSCOPY WITH PROPOFOL;  Surgeon: Lanelle Bal, DO;  [facility];  Service: Endoscopy;  Laterality: N/A;  845am, asa 3   ESOPHAGOGASTRODUODENOSCOPY (EGD) WITH PROPOFOL N/A 09/18/2023   Procedure: ESOPHAGOGASTRODUODENOSCOPY (EGD) WITH PROPOFOL;  Surgeon: Lanelle Bal, DO;  [facility];  Service:  Endoscopy;  Laterality: N/A;   fatty mass removed Left    hip   POLYPECTOMY  09/18/2023   Procedure: POLYPECTOMY;  Surgeon: Lanelle Bal, DO;  [facility];  Service: Endoscopy;;   SALPINGOOPHORECTOMY Bilateral 03/12/2014   Procedure: BILATERAL SALPINGO OOPHORECTOMY;  Surgeon: Tilda Burrow, MD;  Location: [facility];  Service:  Gynecology;  Laterality: Bilateral;   TUBAL LIGATION      Current Outpatient Medications  Medication Sig Dispense Refill   ALPRAZolam (XANAX) 0.5 MG tablet Take 0.5 mg by mouth daily.     amLODipine (NORVASC) 10 MG tablet Take 1 tablet by mouth daily.     hydrochlorothiazide (HYDRODIURIL) 25 MG tablet Take 25 mg by mouth daily.     ondansetron (ZOFRAN) 4 MG tablet Take 1 tablet (4 mg total) by mouth every 8 (eight) hours as needed for nausea or vomiting. 30 tablet 0   pantoprazole (PROTONIX) 40 MG tablet Take 1 tablet (40 mg total) by mouth daily. 30 tablet 5   No current facility-administered medications for this visit.    Allergies as of 11/08/2023 - Review Complete 11/08/2023  Allergen Reaction Noted   Aleve [naproxen sodium] Hives 03/05/2014    Family History  Problem Relation Age of Onset   Hypertension Mother    Hypertension Sister    Colon cancer Neg Hx    Gastric cancer Neg Hx     Social History   Socioeconomic History   Marital status: Divorced    Spouse name: Not on file   Number of children: Not on file   Years of education: Not on file   Highest education level: Not on file  Occupational History   Not on file  Tobacco Use   Smoking status: Never   Smokeless tobacco: Never  Vaping Use   Vaping status: Never Used  Substance and Sexual Activity   Alcohol use: No   Drug use: No   Sexual activity: Yes    Birth control/protection: None  Other Topics Concern   Not on file  Social History Narrative   Not on file   Social Determinants of Health   Financial Resource Strain: Not on file  Food Insecurity: Not on file  Transportation Needs: Not on file  Physical Activity: Not on file  Stress: Not on file  Social Connections: Not on file    Review of Systems: Gen: Denies fever, chills, cold or flulike symptoms, presyncope, syncope. GI: See HPI Heme: See HPI  Physical Exam: BP (!) 162/94   Pulse 84   Temp 97.7 F (36.5 C) (Temporal)   Ht 5\' 6"   (1.676 m)   Wt (!) 344 lb 3.2 oz (156.1 kg)   LMP 02/21/2014   BMI 55.56 kg/m  General:   Alert and oriented. No distress noted. Pleasant and cooperative.  Head:  Normocephalic and atraumatic. Eyes:  Conjuctiva clear without scleral icterus. Abdomen:  +BS, soft, and non-distended. Moderate TTP just above the umbilicus, mild TTP in epigastric and RUQ region.  No rebound or guarding. No HSM or masses noted. Msk:  Symmetrical without gross deformities. Normal posture. Neurologic:  Alert and  oriented x4 Psych:  Normal mood and affect.    Assessment:  62 y.o. female with history of gout, arthritis, anxiety, HTN, adenomatous colon polyp, presenting today for follow-up of epigastric abdominal pain.  Periumbilical/epigastric abdominal pain: Intermittent since March 2024.  Previously taking ibuprofen, but discontinued in August.  Started pantoprazole 40 mg daily in August 2024 and had improvement  initially.  EGD 09/18/2023 with gastritis biopsied, otherwise normal exam.  Pathology with mild nonspecific reactive gastropathy, negative for H.  Pylori.  Now with worsening symptoms since mid October with severe pain occurring at least weekly, waxing and waning all day with associated nausea and vomiting.  Patient reports a knot that comes up above her bellybutton when this occurs.    On exam today, she has moderate TTP just above the umbilicus, epigastric and RUQ tenderness as well.  Differential includes hernia with intermittent incarceration, biliary etiology, pancreatitis.  Will update labs and obtain CT for further evaluation.  She will continue pantoprazole daily for now.     Plan:  CT A/P with contrast ASAP. CBC, CMP, lipase Zofran q8 hrs prn Continue present 40 mg daily for now. Further recommendations to follow.   Ermalinda Memos, PA-C [facility] 11/08/2023

## 2023-11-06 NOTE — Telephone Encounter (Signed)
Refill request was received from [facility] pharmacy for Pantoprazole 40mg  tab Qty: 30 pt was last seen on 08/10/2023

## 2023-11-08 ENCOUNTER — Ambulatory Visit (HOSPITAL_COMMUNITY)
Admission: RE | Admit: 2023-11-08 | Discharge: 2023-11-08 | Disposition: A | Discharge status: Home / Self Care | Payer: 59 | Source: Ambulatory Visit | Attending: Gastroenterology | Admitting: Gastroenterology

## 2023-11-08 ENCOUNTER — Encounter: Payer: Self-pay | Admitting: Gastroenterology

## 2023-11-08 ENCOUNTER — Telehealth: Payer: Self-pay | Admitting: *Deleted

## 2023-11-08 ENCOUNTER — Other Ambulatory Visit (HOSPITAL_COMMUNITY)
Admission: RE | Admit: 2023-11-08 | Discharge: 2023-11-08 | Disposition: A | Discharge status: Home / Self Care | Payer: 59 | Source: Ambulatory Visit | Attending: Gastroenterology | Admitting: Gastroenterology

## 2023-11-08 ENCOUNTER — Ambulatory Visit (INDEPENDENT_AMBULATORY_CARE_PROVIDER_SITE_OTHER): Payer: 59 | Admitting: Gastroenterology

## 2023-11-08 VITALS — BP 162/94 | HR 84 | Temp 97.7°F | Ht 66.0 in | Wt 344.2 lb

## 2023-11-08 DIAGNOSIS — R1013 Epigastric pain: Secondary | ICD-10-CM | POA: Insufficient documentation

## 2023-11-08 DIAGNOSIS — R1011 Right upper quadrant pain: Secondary | ICD-10-CM | POA: Diagnosis present

## 2023-11-08 DIAGNOSIS — R112 Nausea with vomiting, unspecified: Secondary | ICD-10-CM | POA: Diagnosis present

## 2023-11-08 DIAGNOSIS — R1033 Periumbilical pain: Secondary | ICD-10-CM

## 2023-11-08 LAB — CBC WITH DIFFERENTIAL/PLATELET
Abs Immature Granulocytes: 0.03 10*3/uL (ref 0.00–0.07)
Basophils Absolute: 0 10*3/uL (ref 0.0–0.1)
Basophils Relative: 1 %
Eosinophils Absolute: 0.2 10*3/uL (ref 0.0–0.5)
Eosinophils Relative: 3 %
HCT: 40.6 % (ref 36.0–46.0)
Hemoglobin: 13.1 g/dL (ref 12.0–15.0)
Immature Granulocytes: 1 %
Lymphocytes Relative: 22 %
Lymphs Abs: 1.2 10*3/uL (ref 0.7–4.0)
MCH: 28.9 pg (ref 26.0–34.0)
MCHC: 32.3 g/dL (ref 30.0–36.0)
MCV: 89.4 fL (ref 80.0–100.0)
Monocytes Absolute: 0.5 10*3/uL (ref 0.1–1.0)
Monocytes Relative: 9 %
Neutro Abs: 3.7 10*3/uL (ref 1.7–7.7)
Neutrophils Relative %: 64 %
Platelets: 349 10*3/uL (ref 150–400)
RBC: 4.54 MIL/uL (ref 3.87–5.11)
RDW: 14.4 % (ref 11.5–15.5)
WBC: 5.6 10*3/uL (ref 4.0–10.5)
nRBC: 0 % (ref 0.0–0.2)

## 2023-11-08 LAB — LIPASE, BLOOD: Lipase: 40 U/L (ref 11–51)

## 2023-11-08 LAB — COMPREHENSIVE METABOLIC PANEL
ALT: 18 U/L (ref 0–44)
AST: 19 U/L (ref 15–41)
Albumin: 3.5 g/dL (ref 3.5–5.0)
Alkaline Phosphatase: 91 U/L (ref 38–126)
Anion gap: 10 (ref 5–15)
BUN: 11 mg/dL (ref 6–20)
CO2: 26 mmol/L (ref 22–32)
Calcium: 9 mg/dL (ref 8.9–10.3)
Chloride: 99 mmol/L (ref 98–111)
Creatinine, Ser: 0.8 mg/dL (ref 0.44–1.00)
GFR, Estimated: >60 mL/min (ref >60)
Glucose, Bld: 83 mg/dL (ref 70–99)
Potassium: 3.9 mmol/L (ref 3.5–5.1)
Sodium: 135 mmol/L (ref 135–145)
Total Bilirubin: 0.6 mg/dL (ref <1.2)
Total Protein: 7.3 g/dL (ref 6.5–8.1)

## 2023-11-08 MED ORDER — ONDANSETRON HCL 4 MG PO TABS
4.0000 mg | ORAL_TABLET | Freq: Three times a day (TID) | ORAL | 0 refills | Status: AC | PRN
Start: 2023-11-08 — End: ?

## 2023-11-08 MED ORDER — IOHEXOL 300 MG/ML  SOLN
100.0000 mL | Freq: Once | INTRAMUSCULAR | Status: AC | PRN
  Administered 2023-11-08: 100 mL via INTRAVENOUS

## 2023-11-08 NOTE — Telephone Encounter (Signed)
Refill request for pantoprazole 40mg . Sent to [facility] in [place]. Pt last OV 08/10/2023

## 2023-11-08 NOTE — Telephone Encounter (Signed)
[facility] CPT Code: 16109 Description: CT ABDOMEN & PELVIS W/ Authorization Number: [id number] Case Number: [id number] Review Date: 11/08/2023 9:56:37 AM Expiration Date: 12/23/2023 Status: Your case has been Approved. The prior authorization you submitted, Case [id number], has been received. Additional case status notifications will be sent if you opted in for email notifications. Thank you

## 2023-11-08 NOTE — Telephone Encounter (Signed)
Noted  

## 2023-11-08 NOTE — Patient Instructions (Signed)
We will get you scheduled for a CT scan of your abdomen and pelvis at [facility].  When you go to have your CT scan done, stop by the lab at [facility] and have blood work completed.  I am sending in a prescription of Zofran for you to use every 8 hours as needed for nausea if your symptoms were to return.  Continue taking pantoprazole daily for now.  We will have further recommendations for you following labs and imaging results.  Ermalinda Memos, PA-C [facility]

## 2023-11-08 NOTE — Telephone Encounter (Signed)
This was sent in on 11/18.

## 2023-11-13 ENCOUNTER — Other Ambulatory Visit: Payer: Self-pay | Admitting: *Deleted

## 2023-11-13 ENCOUNTER — Telehealth: Payer: Self-pay | Admitting: Gastroenterology

## 2023-11-13 DIAGNOSIS — K439 Ventral hernia without obstruction or gangrene: Secondary | ICD-10-CM

## 2023-11-13 NOTE — Telephone Encounter (Signed)
Patient left a message that she sees the CT results are in MyChart and she has read them but doesn't understand what it means and would like for someone to call her about those results.

## 2023-11-13 NOTE — Telephone Encounter (Signed)
Spoke to pt, informed her of result and recommendations. Pt voiced understanding.

## 2023-11-14 ENCOUNTER — Other Ambulatory Visit: Payer: Self-pay | Admitting: *Deleted

## 2023-11-14 DIAGNOSIS — E279 Disorder of adrenal gland, unspecified: Secondary | ICD-10-CM

## 2023-11-17 ENCOUNTER — Ambulatory Visit: Payer: 59

## 2023-11-21 ENCOUNTER — Ambulatory Visit (INDEPENDENT_AMBULATORY_CARE_PROVIDER_SITE_OTHER): Payer: 59 | Admitting: General Surgery

## 2023-11-21 ENCOUNTER — Encounter: Payer: Self-pay | Admitting: General Surgery

## 2023-11-21 VITALS — BP 161/84 | HR 63 | Temp 97.6°F | Resp 14 | Ht 66.0 in | Wt 344.0 lb

## 2023-11-21 DIAGNOSIS — K432 Incisional hernia without obstruction or gangrene: Secondary | ICD-10-CM | POA: Diagnosis not present

## 2023-11-21 NOTE — H&P (Signed)
Renee Vargas; [id number]; [birth year]   HPI Patient is a 62 year old black female who was referred to my care by Terie Purser and Ermalinda Memos of GI for evaluation and treatment of a ventral hernia.  This was noted recently on CT scan of the abdomen to assess for pain.  She is status post hysterectomy in the remote past.  She has pain and swelling around the umbilicus.  It is made worse with straining.  She states it has been present for many months and seems to be worsening.  No episode of incarceration is noted. Past Medical History:  Diagnosis Date   Anemia    Anxiety    GERD (gastroesophageal reflux disease)    History of gout    Hypertension     Past Surgical History:  Procedure Laterality Date   ABDOMINAL HYSTERECTOMY N/A 03/12/2014   Procedure: SUPRACERVICAL HYSTERECTOMY;  Surgeon: Tilda Burrow, MD;  Location: [facility];  Service: Gynecology;  Laterality: N/A;   BIOPSY  09/18/2023   Procedure: BIOPSY;  Surgeon: Lanelle Bal, DO;  Location: [facility];  Service: Endoscopy;;   COLONOSCOPY N/A 07/13/2017   Surgeon: West Bali, MD; 8 mm tubular adenoma, sigmoid and descending colon diverticulosis, external and internal hemorrhoids.  Recommended repeat in 5-10 years.   COLONOSCOPY WITH PROPOFOL N/A 09/18/2023   Procedure: COLONOSCOPY WITH PROPOFOL;  Surgeon: Lanelle Bal, DO;  Location: [facility];  Service: Endoscopy;  Laterality: N/A;  845am, asa 3   ESOPHAGOGASTRODUODENOSCOPY (EGD) WITH PROPOFOL N/A 09/18/2023   Procedure: ESOPHAGOGASTRODUODENOSCOPY (EGD) WITH PROPOFOL;  Surgeon: Lanelle Bal, DO;  [facility]: [facility];  Service: Endoscopy;  Laterality: N/A;   fatty mass removed Left    hip   POLYPECTOMY  09/18/2023   Procedure: POLYPECTOMY;  Surgeon: Lanelle Bal, DO;  Location: [facility];  Service: Endoscopy;;   SALPINGOOPHORECTOMY Bilateral 03/12/2014   Procedure: BILATERAL SALPINGO OOPHORECTOMY;  Surgeon: Tilda Burrow, MD;  Location:  [facility];  Service: Gynecology;  Laterality: Bilateral;   TUBAL LIGATION      Family History  Problem Relation Age of Onset   Hypertension Mother    Hypertension Sister    Colon cancer Neg Hx    Gastric cancer Neg Hx     Current Outpatient Medications on File Prior to Visit  Medication Sig Dispense Refill   ALPRAZolam (XANAX) 0.5 MG tablet Take 0.5 mg by mouth daily.     amLODipine (NORVASC) 10 MG tablet Take 1 tablet by mouth daily.     hydrochlorothiazide (HYDRODIURIL) 25 MG tablet Take 25 mg by mouth daily.     ondansetron (ZOFRAN) 4 MG tablet Take 1 tablet (4 mg total) by mouth every 8 (eight) hours as needed for nausea or vomiting. 30 tablet 0   pantoprazole (PROTONIX) 40 MG tablet Take 1 tablet (40 mg total) by mouth daily. 30 tablet 5   predniSONE (DELTASONE) 20 MG tablet Take 20 mg by mouth daily with breakfast.     No current facility-administered medications on file prior to visit.    Allergies  Allergen Reactions   Aleve [Naproxen Sodium] Hives    Social History   Substance and Sexual Activity  Alcohol Use No    Social History   Tobacco Use  Smoking Status Never  Smokeless Tobacco Never    Review of Systems  Constitutional: Negative.   HENT: Negative.    Eyes: Negative.   Respiratory: Negative.    Cardiovascular: Negative.   Gastrointestinal:  Positive for abdominal pain and nausea.  Genitourinary: Negative.   Musculoskeletal:  Positive for joint pain.  Skin: Negative.   Neurological: Negative.   Endo/Heme/Allergies: Negative.   Psychiatric/Behavioral: Negative.      Objective   Vitals:   11/21/23 1110  BP: (!) 161/84  Pulse: 63  Resp: 14  Temp: 97.6 F (36.4 C)  SpO2: 94%    Physical Exam Vitals reviewed.  Constitutional:      Appearance: Normal appearance. She is obese. She is not ill-appearing.  HENT:     Head: Normocephalic and atraumatic.  Cardiovascular:     Rate and Rhythm: Normal rate and regular rhythm.     Heart sounds:  Normal heart sounds. No murmur heard.    No friction rub. No gallop.  Pulmonary:     Effort: Pulmonary effort is normal. No respiratory distress.     Breath sounds: Normal breath sounds. No stridor. No wheezing, rhonchi or rales.  Abdominal:     General: Bowel sounds are normal. There is no distension.     Palpations: Abdomen is soft. There is no mass.     Tenderness: There is no abdominal tenderness. There is no guarding or rebound.     Hernia: A hernia is present.     Comments: A 4 to 5 cm periumbilical hernia is present along the superior aspect of a lower midline surgical scar.  The contents are easily reducible.  Skin:    General: Skin is warm and dry.  Neurological:     Mental Status: She is alert and oriented to person, place, and time.    CT the scan of the abdomen pelvis reveals a ventral hernia containing bowel contents. Assessment  Incisional hernia Plan  The patient is scheduled for a robotic assisted laparoscopic incisional herniorrhaphy with mesh on 12/01/2023.  The risks and benefits of the procedure including bleeding, infection, mesh use, the possibility of an open procedure, and the possibility of recurrence of the hernia were fully explained to the patient, who gave informed consent.

## 2023-11-21 NOTE — Progress Notes (Signed)
Renee Vargas; [id number]; [birth year]   HPI Patient is a 62 year old black female who was referred to my care by Terie Purser and Ermalinda Memos of GI for evaluation and treatment of a ventral hernia.  This was noted recently on CT scan of the abdomen to assess for pain.  She is status post hysterectomy in the remote past.  She has pain and swelling around the umbilicus.  It is made worse with straining.  She states it has been present for many months and seems to be worsening.  No episode of incarceration is noted. Past Medical History:  Diagnosis Date   Anemia    Anxiety    GERD (gastroesophageal reflux disease)    History of gout    Hypertension     Past Surgical History:  Procedure Laterality Date   ABDOMINAL HYSTERECTOMY N/A 03/12/2014   Procedure: SUPRACERVICAL HYSTERECTOMY;  Surgeon: Tilda Burrow, MD;  Location: [facility];  Service: Gynecology;  Laterality: N/A;   BIOPSY  09/18/2023   Procedure: BIOPSY;  Surgeon: Lanelle Bal, DO;  [facility];  Service: Endoscopy;;   COLONOSCOPY N/A 07/13/2017   Surgeon: West Bali, MD; 8 mm tubular adenoma, sigmoid and descending colon diverticulosis, external and internal hemorrhoids.  Recommended repeat in 5-10 years.   COLONOSCOPY WITH PROPOFOL N/A 09/18/2023   Procedure: COLONOSCOPY WITH PROPOFOL;  Surgeon: Lanelle Bal, DO;  [facility]: [facility];  Service: Endoscopy;  Laterality: N/A;  845am, asa 3   ESOPHAGOGASTRODUODENOSCOPY (EGD) WITH PROPOFOL N/A 09/18/2023   Procedure: ESOPHAGOGASTRODUODENOSCOPY (EGD) WITH PROPOFOL;  Surgeon: Lanelle Bal, DO;  [facility];  Service: Endoscopy;  Laterality: N/A;   fatty mass removed Left    hip   POLYPECTOMY  09/18/2023   Procedure: POLYPECTOMY;  Surgeon: Lanelle Bal, DO;  [facility]: [facility];  Service: Endoscopy;;   SALPINGOOPHORECTOMY Bilateral 03/12/2014   Procedure: BILATERAL SALPINGO OOPHORECTOMY;  Surgeon: Tilda Burrow, MD;  Location:  [facility];  Service: Gynecology;  Laterality: Bilateral;   TUBAL LIGATION      Family History  Problem Relation Age of Onset   Hypertension Mother    Hypertension Sister    Colon cancer Neg Hx    Gastric cancer Neg Hx     Current Outpatient Medications on File Prior to Visit  Medication Sig Dispense Refill   ALPRAZolam (XANAX) 0.5 MG tablet Take 0.5 mg by mouth daily.     amLODipine (NORVASC) 10 MG tablet Take 1 tablet by mouth daily.     hydrochlorothiazide (HYDRODIURIL) 25 MG tablet Take 25 mg by mouth daily.     ondansetron (ZOFRAN) 4 MG tablet Take 1 tablet (4 mg total) by mouth every 8 (eight) hours as needed for nausea or vomiting. 30 tablet 0   pantoprazole (PROTONIX) 40 MG tablet Take 1 tablet (40 mg total) by mouth daily. 30 tablet 5   predniSONE (DELTASONE) 20 MG tablet Take 20 mg by mouth daily with breakfast.     No current facility-administered medications on file prior to visit.    Allergies  Allergen Reactions   Aleve [Naproxen Sodium] Hives    Social History   Substance and Sexual Activity  Alcohol Use No    Social History   Tobacco Use  Smoking Status Never  Smokeless Tobacco Never    Review of Systems  Constitutional: Negative.   HENT: Negative.    Eyes: Negative.   Respiratory: Negative.    Cardiovascular: Negative.   Gastrointestinal:  Positive for abdominal pain and nausea.  Genitourinary: Negative.   Musculoskeletal:  Positive for joint pain.  Skin: Negative.   Neurological: Negative.   Endo/Heme/Allergies: Negative.   Psychiatric/Behavioral: Negative.      Objective   Vitals:   11/21/23 1110  BP: (!) 161/84  Pulse: 63  Resp: 14  Temp: 97.6 F (36.4 C)  SpO2: 94%    Physical Exam Vitals reviewed.  Constitutional:      Appearance: Normal appearance. She is obese. She is not ill-appearing.  HENT:     Head: Normocephalic and atraumatic.  Cardiovascular:     Rate and Rhythm: Normal rate and regular rhythm.     Heart sounds:  Normal heart sounds. No murmur heard.    No friction rub. No gallop.  Pulmonary:     Effort: Pulmonary effort is normal. No respiratory distress.     Breath sounds: Normal breath sounds. No stridor. No wheezing, rhonchi or rales.  Abdominal:     General: Bowel sounds are normal. There is no distension.     Palpations: Abdomen is soft. There is no mass.     Tenderness: There is no abdominal tenderness. There is no guarding or rebound.     Hernia: A hernia is present.     Comments: A 4 to 5 cm periumbilical hernia is present along the superior aspect of a lower midline surgical scar.  The contents are easily reducible.  Skin:    General: Skin is warm and dry.  Neurological:     Mental Status: She is alert and oriented to person, place, and time.    CT the scan of the abdomen pelvis reveals a ventral hernia containing bowel contents. Assessment  Incisional hernia Plan  The patient is scheduled for a robotic assisted laparoscopic incisional herniorrhaphy with mesh on 12/01/2023.  The risks and benefits of the procedure including bleeding, infection, mesh use, the possibility of an open procedure, and the possibility of recurrence of the hernia were fully explained to the patient, who gave informed consent.

## 2023-11-28 ENCOUNTER — Encounter (HOSPITAL_COMMUNITY)
Admission: RE | Admit: 2023-11-28 | Discharge: 2023-11-28 | Disposition: A | Discharge status: Home / Self Care | Payer: 59 | Source: Ambulatory Visit | Attending: General Surgery

## 2023-11-29 ENCOUNTER — Encounter (HOSPITAL_COMMUNITY): Payer: Self-pay | Admitting: Radiology

## 2023-11-29 ENCOUNTER — Ambulatory Visit (HOSPITAL_COMMUNITY)
Admission: RE | Admit: 2023-11-29 | Discharge: 2023-11-29 | Disposition: A | Discharge status: Home / Self Care | Payer: 59 | Source: Ambulatory Visit | Attending: Gastroenterology | Admitting: Gastroenterology

## 2023-11-29 DIAGNOSIS — E279 Disorder of adrenal gland, unspecified: Secondary | ICD-10-CM | POA: Diagnosis present

## 2023-11-29 MED ORDER — IOHEXOL 300 MG/ML  SOLN
100.0000 mL | Freq: Once | INTRAMUSCULAR | Status: AC | PRN
  Administered 2023-11-29: 100 mL via INTRAVENOUS

## 2023-12-01 ENCOUNTER — Other Ambulatory Visit: Payer: Self-pay

## 2023-12-01 ENCOUNTER — Ambulatory Visit (HOSPITAL_COMMUNITY): Payer: 59 | Admitting: Anesthesiology

## 2023-12-01 ENCOUNTER — Ambulatory Visit (HOSPITAL_COMMUNITY)
Admission: RE | Admit: 2023-12-01 | Discharge: 2023-12-01 | Disposition: A | Discharge status: Home / Self Care | Payer: 59 | Attending: General Surgery | Admitting: General Surgery

## 2023-12-01 ENCOUNTER — Encounter (HOSPITAL_COMMUNITY)
Admission: RE | Disposition: A | Discharge status: Home / Self Care | Payer: Self-pay | Source: Home / Self Care | Attending: General Surgery

## 2023-12-01 ENCOUNTER — Encounter (HOSPITAL_COMMUNITY): Payer: Self-pay | Admitting: General Surgery

## 2023-12-01 DIAGNOSIS — K432 Incisional hernia without obstruction or gangrene: Secondary | ICD-10-CM | POA: Insufficient documentation

## 2023-12-01 DIAGNOSIS — K219 Gastro-esophageal reflux disease without esophagitis: Secondary | ICD-10-CM | POA: Insufficient documentation

## 2023-12-01 DIAGNOSIS — Z6841 Body Mass Index (BMI) 40.0 and over, adult: Secondary | ICD-10-CM | POA: Diagnosis not present

## 2023-12-01 DIAGNOSIS — I10 Essential (primary) hypertension: Secondary | ICD-10-CM | POA: Insufficient documentation

## 2023-12-01 HISTORY — PX: XI ROBOTIC ASSISTED VENTRAL HERNIA: SHX6789

## 2023-12-01 SURGERY — REPAIR, HERNIA, VENTRAL, ROBOT-ASSISTED
Anesthesia: General | Site: Abdomen

## 2023-12-01 MED ORDER — CHLORHEXIDINE GLUCONATE CLOTH 2 % EX PADS: 6.0000 | MEDICATED_PAD | Freq: Once | CUTANEOUS | Status: DC

## 2023-12-01 MED ORDER — PROPOFOL 10 MG/ML IV BOLUS
INTRAVENOUS | Status: AC
  Filled 2023-12-01: qty 20

## 2023-12-01 MED ORDER — HYDROMORPHONE HCL 1 MG/ML IJ SOLN
INTRAMUSCULAR | Status: AC
  Filled 2023-12-01: qty 1

## 2023-12-01 MED ORDER — BUPIVACAINE HCL (PF) 0.5 % IJ SOLN
INTRAMUSCULAR | Status: AC
  Filled 2023-12-01: qty 30

## 2023-12-01 MED ORDER — ONDANSETRON HCL 4 MG/2ML IJ SOLN
INTRAMUSCULAR | Status: AC
Start: 2023-12-01 — End: ?
  Filled 2023-12-01: qty 2

## 2023-12-01 MED ORDER — HYDROMORPHONE HCL 1 MG/ML IJ SOLN
INTRAMUSCULAR | Status: DC | PRN
  Administered 2023-12-01 (×2): .5 mg via INTRAVENOUS

## 2023-12-01 MED ORDER — ORAL CARE MOUTH RINSE: 15.0000 mL | Freq: Once | OROMUCOSAL | Status: AC

## 2023-12-01 MED ORDER — MIDAZOLAM HCL 2 MG/2ML IJ SOLN
INTRAMUSCULAR | Status: DC | PRN
  Administered 2023-12-01: 2 mg via INTRAVENOUS

## 2023-12-01 MED ORDER — DEXAMETHASONE SODIUM PHOSPHATE 10 MG/ML IJ SOLN
INTRAMUSCULAR | Status: DC | PRN
  Administered 2023-12-01: 10 mg via INTRAVENOUS

## 2023-12-01 MED ORDER — FENTANYL CITRATE PF 50 MCG/ML IJ SOSY
25.0000 ug | PREFILLED_SYRINGE | INTRAMUSCULAR | Status: AC | PRN
  Administered 2023-12-01 (×6): 25 ug via INTRAVENOUS
  Filled 2023-12-01 (×3): qty 1

## 2023-12-01 MED ORDER — ONDANSETRON HCL 4 MG/2ML IJ SOLN
INTRAMUSCULAR | Status: DC | PRN
  Administered 2023-12-01: 4 mg via INTRAVENOUS

## 2023-12-01 MED ORDER — LIDOCAINE HCL (PF) 2 % IJ SOLN
INTRAMUSCULAR | Status: AC
  Filled 2023-12-01: qty 5

## 2023-12-01 MED ORDER — ROCURONIUM BROMIDE 10 MG/ML (PF) SYRINGE
PREFILLED_SYRINGE | INTRAVENOUS | Status: AC
  Filled 2023-12-01: qty 10

## 2023-12-01 MED ORDER — BUPIVACAINE HCL (PF) 0.5 % IJ SOLN
INTRAMUSCULAR | Status: DC | PRN
  Administered 2023-12-01: 30 mL

## 2023-12-01 MED ORDER — FENTANYL CITRATE (PF) 100 MCG/2ML IJ SOLN
INTRAMUSCULAR | Status: AC
  Filled 2023-12-01: qty 2

## 2023-12-01 MED ORDER — FENTANYL CITRATE (PF) 100 MCG/2ML IJ SOLN
INTRAMUSCULAR | Status: DC | PRN
  Administered 2023-12-01: 50 ug via INTRAVENOUS
  Administered 2023-12-01: 100 ug via INTRAVENOUS
  Administered 2023-12-01 (×3): 50 ug via INTRAVENOUS

## 2023-12-01 MED ORDER — SUGAMMADEX SODIUM 200 MG/2ML IV SOLN
INTRAVENOUS | Status: DC | PRN
  Administered 2023-12-01: 400 mg via INTRAVENOUS

## 2023-12-01 MED ORDER — ROCURONIUM BROMIDE 10 MG/ML (PF) SYRINGE
PREFILLED_SYRINGE | INTRAVENOUS | Status: DC | PRN
  Administered 2023-12-01: 80 mg via INTRAVENOUS
  Administered 2023-12-01: 20 mg via INTRAVENOUS

## 2023-12-01 MED ORDER — STERILE WATER FOR IRRIGATION IR SOLN
Status: DC | PRN
  Administered 2023-12-01: 500 mL

## 2023-12-01 MED ORDER — CHLORHEXIDINE GLUCONATE CLOTH 2 % EX PADS
6.0000 | MEDICATED_PAD | Freq: Once | CUTANEOUS | Status: AC
  Administered 2023-12-01: 6 via TOPICAL

## 2023-12-01 MED ORDER — LIDOCAINE HCL (CARDIAC) PF 100 MG/5ML IV SOSY
PREFILLED_SYRINGE | INTRAVENOUS | Status: DC | PRN
  Administered 2023-12-01: 100 mg via INTRAVENOUS

## 2023-12-01 MED ORDER — LACTATED RINGERS IV SOLN: INTRAVENOUS | Status: DC

## 2023-12-01 MED ORDER — OXYCODONE HCL 5 MG/5ML PO SOLN: 5.0000 mg | Freq: Once | ORAL | Status: AC | PRN

## 2023-12-01 MED ORDER — DEXAMETHASONE SODIUM PHOSPHATE 10 MG/ML IJ SOLN
INTRAMUSCULAR | Status: AC
  Filled 2023-12-01: qty 1

## 2023-12-01 MED ORDER — CHLORHEXIDINE GLUCONATE 0.12 % MT SOLN
15.0000 mL | Freq: Once | OROMUCOSAL | Status: AC
Start: 2023-12-01 — End: 2023-12-01
  Administered 2023-12-01: 15 mL via OROMUCOSAL

## 2023-12-01 MED ORDER — ONDANSETRON HCL 4 MG/2ML IJ SOLN: 4.0000 mg | Freq: Once | INTRAMUSCULAR | Status: DC | PRN

## 2023-12-01 MED ORDER — METHOCARBAMOL 750 MG PO TABS: 750.0000 mg | ORAL_TABLET | Freq: Four times a day (QID) | ORAL | 1 refills | Status: AC | PRN

## 2023-12-01 MED ORDER — OXYCODONE HCL 5 MG PO TABS: 5.0000 mg | ORAL_TABLET | Freq: Four times a day (QID) | ORAL | 0 refills | Status: AC | PRN

## 2023-12-01 MED ORDER — CEFAZOLIN IN SODIUM CHLORIDE 3-0.9 GM/100ML-% IV SOLN
3.0000 g | INTRAVENOUS | Status: AC
  Administered 2023-12-01: 3 g via INTRAVENOUS
  Filled 2023-12-01: qty 100

## 2023-12-01 MED ORDER — PROPOFOL 10 MG/ML IV BOLUS
INTRAVENOUS | Status: DC | PRN
  Administered 2023-12-01: 250 mg via INTRAVENOUS
  Administered 2023-12-01: 50 mg via INTRAVENOUS

## 2023-12-01 MED ORDER — MIDAZOLAM HCL 2 MG/2ML IJ SOLN
INTRAMUSCULAR | Status: AC
Start: 2023-12-01 — End: ?
  Filled 2023-12-01: qty 2

## 2023-12-01 MED ORDER — OXYCODONE HCL 5 MG PO TABS
5.0000 mg | ORAL_TABLET | Freq: Once | ORAL | Status: AC | PRN
  Administered 2023-12-01: 5 mg via ORAL
  Filled 2023-12-01: qty 1

## 2023-12-01 SURGICAL SUPPLY — 39 items
CHLORAPREP W/TINT 26 (MISCELLANEOUS) ×2 IMPLANT
COVER LIGHT HANDLE STERIS (MISCELLANEOUS) ×4 IMPLANT
COVER MAYO STAND XLG (MISCELLANEOUS) ×2 IMPLANT
COVER TIP SHEARS 8 DVNC (MISCELLANEOUS) ×2 IMPLANT
DERMABOND ADVANCED .7 DNX12 (GAUZE/BANDAGES/DRESSINGS) ×2 IMPLANT
DRAPE ARM DVNC X/XI (DISPOSABLE) ×6 IMPLANT
DRAPE COLUMN DVNC XI (DISPOSABLE) ×2 IMPLANT
DRIVER NDL MEGA SUTCUT DVNCXI (INSTRUMENTS) ×2 IMPLANT
DRIVER NDLE MEGA SUTCUT DVNCXI (INSTRUMENTS) ×1
ELECT REM PT RETURN 9FT ADLT (ELECTROSURGICAL) ×1
ELECTRODE REM PT RTRN 9FT ADLT (ELECTROSURGICAL) ×2 IMPLANT
FORCEPS BPLR R/ABLATION 8 DVNC (INSTRUMENTS) ×2 IMPLANT
GLOVE BIO SURGEON STRL SZ7 (GLOVE) IMPLANT
GLOVE BIOGEL PI IND STRL 7.0 (GLOVE) ×8 IMPLANT
GLOVE SURG SS PI 7.5 STRL IVOR (GLOVE) ×4 IMPLANT
GOWN STRL REUS W/TWL LRG LVL3 (GOWN DISPOSABLE) ×6 IMPLANT
KIT TURNOVER KIT A (KITS) ×2 IMPLANT
MESH VENTRALIGHT ST 4X6IN (Mesh General) IMPLANT
NDL HYPO 21X1.5 SAFETY (NEEDLE) ×2 IMPLANT
NDL INSUFFLATION 14GA 120MM (NEEDLE) ×2 IMPLANT
NEEDLE HYPO 21X1.5 SAFETY (NEEDLE) ×1
NEEDLE INSUFFLATION 14GA 120MM (NEEDLE) ×1
OBTURATOR OPTICAL STND 8 DVNC (TROCAR) ×1
OBTURATOR OPTICALSTD 8 DVNC (TROCAR) ×2 IMPLANT
PACK LAP CHOLE LZT030E (CUSTOM PROCEDURE TRAY) ×2 IMPLANT
PAD ARMBOARD 7.5X6 YLW CONV (MISCELLANEOUS) ×2 IMPLANT
PENCIL HANDSWITCHING (ELECTRODE) ×2 IMPLANT
POSITIONER HEAD 8X9X4 ADT (SOFTGOODS) ×2 IMPLANT
SCISSORS MNPLR CVD DVNC XI (INSTRUMENTS) ×2 IMPLANT
SEAL UNIV 5-12 XI (MISCELLANEOUS) ×6 IMPLANT
SET BASIN LINEN APH (SET/KITS/TRAYS/PACK) ×2 IMPLANT
SET TUBE SMOKE EVAC HIGH FLOW (TUBING) ×2 IMPLANT
SUT MNCRL AB 4-0 PS2 18 (SUTURE) ×4 IMPLANT
SUT STRATAFIX 0 PDS+ CT-2 23 (SUTURE) ×1
SUT V-LOC 90 ABS 3-0 VLT V-20 (SUTURE) ×4 IMPLANT
SUTURE STRATFX 0 PDS+ CT-2 23 (SUTURE) ×2 IMPLANT
SYR 30ML LL (SYRINGE) ×2 IMPLANT
TAPE TRANSPORE STRL 2 31045 (GAUZE/BANDAGES/DRESSINGS) ×4 IMPLANT
WATER STERILE IRR 500ML POUR (IV SOLUTION) ×2 IMPLANT

## 2023-12-01 NOTE — Anesthesia Procedure Notes (Signed)
Procedure Name: Intubation Date/Time: 12/01/2023 7:42 AM  Performed by: Oletha Cruel, CRNAPre-anesthesia Checklist: Patient identified, Emergency Drugs available, Suction available and Patient being monitored Patient Re-evaluated:Patient Re-evaluated prior to induction Oxygen Delivery Method: Circle system utilized Preoxygenation: Pre-oxygenation with 100% oxygen Induction Type: IV induction Ventilation: Mask ventilation without difficulty Laryngoscope Size: Mac and 4 Grade View: Grade II Tube type: Oral Tube size: 7.5 mm Number of attempts: 1 Airway Equipment and Method: Stylet Placement Confirmation: ETT inserted through vocal cords under direct vision, positive ETCO2, CO2 detector and breath sounds checked- equal and bilateral Secured at: 22 cm Tube secured with: Tape Dental Injury: Teeth and Oropharynx as per pre-operative assessment  Comments: Atraumatic intubation. Lips and teeth remain in preoperative condition.

## 2023-12-01 NOTE — Interval H&P Note (Signed)
History and Physical Interval Note:  12/01/2023 7:18 AM  Renee Vargas  has presented today for surgery, with the diagnosis of HERNIA, INCISIONAL, GREATER THAN 3 CM.  The various methods of treatment have been discussed with the patient and family. After consideration of risks, benefits and other options for treatment, the patient has consented to  Procedure(s): XI ROBOTIC ASSISTED VENTRAL HERNIA REPAIR W/ MESH (N/A) as a surgical intervention.  The patient's history has been reviewed, patient examined, no change in status, stable for surgery.  I have reviewed the patient's chart and labs.  Questions were answered to the patient's satisfaction.     [name]

## 2023-12-01 NOTE — Progress Notes (Signed)
RN ambulated patient to phase ii. Patient tolerated fair.

## 2023-12-01 NOTE — Anesthesia Preprocedure Evaluation (Addendum)
Anesthesia Evaluation  Patient identified by MRN, date of birth, ID band Patient awake    Reviewed: Allergy & Precautions, H&P , NPO status , Patient's Chart, lab work & pertinent test results, reviewed documented beta blocker date and time   Airway Mallampati: II  TM Distance: >3 FB Neck ROM: full    Dental no notable dental hx.    Pulmonary neg pulmonary ROS   Pulmonary exam normal breath sounds clear to auscultation       Cardiovascular Exercise Tolerance: Good hypertension,  Rhythm:regular Rate:Normal     Neuro/Psych   Anxiety     negative neurological ROS  negative psych ROS   GI/Hepatic Neg liver ROS,GERD  Medicated,,  Endo/Other    Class 4 obesity  Renal/GU negative Renal ROS  negative genitourinary   Musculoskeletal   Abdominal   Peds  Hematology  (+) Blood dyscrasia, anemia   Anesthesia Other Findings   Reproductive/Obstetrics negative OB ROS                             Anesthesia Physical Anesthesia Plan  ASA: 3  Anesthesia Plan: General and General ETT   Post-op Pain Management:    Induction:   PONV Risk Score and Plan: Ondansetron  Airway Management Planned:   Additional Equipment:   Intra-op Plan:   Post-operative Plan:   Informed Consent: I have reviewed the patients History and Physical, chart, labs and discussed the procedure including the risks, benefits and alternatives for the proposed anesthesia with the patient or authorized representative who has indicated his/her understanding and acceptance.     Dental Advisory Given  Plan Discussed with: CRNA  Anesthesia Plan Comments:        Anesthesia Quick Evaluation

## 2023-12-01 NOTE — Transfer of Care (Signed)
Immediate Anesthesia Transfer of Care Note  Patient: Renee Vargas  Procedure(s) Performed: XI ROBOTIC ASSISTED VENTRAL HERNIA REPAIR W/ MESH (Abdomen)  Patient Location: PACU  Anesthesia Type:General  Level of Consciousness: alert  and patient cooperative  Airway & Oxygen Therapy: Patient Spontanous Breathing and Patient connected to face mask oxygen  Post-op Assessment: Report given to RN and Post -op Vital signs reviewed and stable  Post vital signs: Reviewed and stable  Last Vitals:  Vitals Value Taken Time  BP 130/81 12/01/23 0939  Temp 36.7 C 12/01/23 0939  Pulse 88 12/01/23 0944  Resp 17 12/01/23 0944  SpO2 91 % 12/01/23 0944  Vitals shown include unfiled device data.  Last Pain:  Vitals:   12/01/23 0636  TempSrc: Oral  PainSc: 0-No pain      Patients Stated Pain Goal: 5 (12/01/23 0636)  Complications: No notable events documented.

## 2023-12-01 NOTE — Progress Notes (Signed)
Patient sleeping between assessments.  Oxygen saturations also dropping between assessments due to patient not deep breathing.  Pain medication given for patients pain scale of 10.

## 2023-12-01 NOTE — Op Note (Signed)
Patient:  Renee Vargas  DOB:  1961/03/16  MRN:  [id number]   Preop Diagnosis: Incisional hernia  Postop Diagnosis: Same  Procedure: Robotic assisted laparoscopic incisional herniorrhaphy with mesh  Surgeon: [name], MD  Anes: General Endotracheal  Indications: Patient is a 62 year old black female who presents with an incisional hernia.  The risks and benefits of the procedure including bleeding, infection, mesh use, and the possibility of recurrence of the hernia were fully explained to the patient, who gave informed consent.  Procedure note: The patient was placed in the supine position.  After induction of general endotracheal anesthesia, the abdomen was prepped and draped using the usual sterile technique with ChloraPrep.  Surgical site confirmation was performed.  An incision was made in the left upper quadrant at Palmer's point.  A Veress needle was introduced into the abdominal cavity and confirmation of placement was done using the saline drop test.  I was able to partially insufflate the abdominal cavity, but due to her obesity, I elected to proceed with a direct visualization insertion of the 8 mm trocar.  This was done without difficulty.  Additional 8 mm trocars were placed on the left flank and left lower quadrant regions.  The robot was then docked and targeted.  The patient had omentum as well as a loop of bowel which was adhesed to the hernia sac in the periumbilical region.  This was taken down using scissors and judicious use of cautery.  The resultant defect measured approximately 5 cm in its greatest diameter.  The hernia defect was closed using an 0 STRATAFIX running suture longitudinally.  A 10 x 15 cm Bard Ventralight dual mesh was then secured to the anterior abdominal wall using 3-0 V-Loc running sutures.  The robot was then undocked and all air was evacuated from the abdominal cavity prior to removal of the trocars.  All wounds were irrigated with normal saline.   All wounds were injected with 0.5% Sensorcaine.  All incisions were closed using a 4-0 Monocryl subcuticular suture.  Dermabond was applied.  All tape and needle counts were correct at the end of the procedure.  The patient was extubated in the operating room and transferred to PACU in stable condition.  Complications: None  EBL: Minimal  Specimen: None

## 2023-12-01 NOTE — Anesthesia Postprocedure Evaluation (Signed)
Anesthesia Post Note  Patient: Renee Vargas  Procedure(s) Performed: XI ROBOTIC ASSISTED VENTRAL HERNIA REPAIR W/ MESH (Abdomen)  Patient location during evaluation: Phase II Anesthesia Type: General Level of consciousness: awake Pain management: pain level controlled Vital Signs Assessment: post-procedure vital signs reviewed and stable Respiratory status: spontaneous breathing and respiratory function stable Cardiovascular status: blood pressure returned to baseline and stable Postop Assessment: no headache and no apparent nausea or vomiting Anesthetic complications: no Comments: Late entry   No notable events documented.   Last Vitals:  Vitals:   12/01/23 1100 12/01/23 1115  BP:  121/71  Pulse: 85 80  Resp:  18  Temp:  36.9 C  SpO2: 92%     Last Pain:  Vitals:   12/01/23 1115  TempSrc: Oral  PainSc:                  Windell Norfolk

## 2023-12-06 ENCOUNTER — Encounter (HOSPITAL_COMMUNITY): Payer: Self-pay | Admitting: General Surgery

## 2023-12-07 ENCOUNTER — Encounter: Payer: Self-pay | Admitting: *Deleted

## 2023-12-07 ENCOUNTER — Ambulatory Visit (INDEPENDENT_AMBULATORY_CARE_PROVIDER_SITE_OTHER): Payer: 59 | Admitting: General Surgery

## 2023-12-07 ENCOUNTER — Encounter: Payer: Self-pay | Admitting: General Surgery

## 2023-12-07 VITALS — BP 171/104 | HR 63 | Temp 98.1°F | Resp 12 | Ht 66.0 in | Wt 340.0 lb

## 2023-12-07 DIAGNOSIS — Z8719 Personal history of other diseases of the digestive system: Secondary | ICD-10-CM

## 2023-12-07 DIAGNOSIS — Z9889 Other specified postprocedural states: Secondary | ICD-10-CM

## 2023-12-07 NOTE — Progress Notes (Signed)
Subjective:     Renee Vargas  Patient here for follow-up, status post robotic assisted ventral herniorrhaphy with mesh.  Patient states she is doing well.  She is increasing her activity as able.  She has no incisional pain. Objective:    BP (!) 171/104   Pulse 63   Temp 98.1 F (36.7 C) (Oral)   Resp 12   Ht 5\' 6"  (1.676 m)   Wt (!) 340 lb (154.2 kg)   LMP 02/21/2014   SpO2 98%   BMI 54.88 kg/m   General:  alert, cooperative, and no distress  Abdomen soft, incisions healing well.     Assessment:    Doing well postoperatively.    Plan:   Patient may return to work with limiting her hours of standing to 5 to 6 hours.  She then may return to full duty without restrictions on 12/27/2023.  Follow-up here as needed.

## 2024-03-04 ENCOUNTER — Telehealth: Admitting: Nurse Practitioner

## 2024-03-04 DIAGNOSIS — J40 Bronchitis, not specified as acute or chronic: Secondary | ICD-10-CM

## 2024-03-04 MED ORDER — AZITHROMYCIN 250 MG PO TABS: ORAL_TABLET | ORAL | 0 refills | Status: AC

## 2024-03-04 MED ORDER — PREDNISONE 20 MG PO TABS: 20.0000 mg | ORAL_TABLET | Freq: Two times a day (BID) | ORAL | 0 refills | Status: AC

## 2024-03-04 NOTE — Progress Notes (Signed)
 E-Visit for Cough   We are sorry that you are not feeling well.  Here is how we plan to help!  Based on your presentation I believe you most likely have A cough due to bacteria.  When patients have a fever and a productive cough with a change in color or increased sputum production, we are concerned about bacterial bronchitis.  If left untreated it can progress to pneumonia.  If your symptoms do not improve with your treatment plan it is important that you contact your provider.   I have prescribed Azithromyin 250 mg: two tablets now and then one tablet daily for 4 additonal days    In addition you may use A non-prescription cough medication called Mucinex DM: take 2 tablets every 12 hours. and A prescription cough medication called Tessalon Perles 100mg . You may take 1-2 capsules every 8 hours as needed for your cough.  Prednisone 20 mg twice daily for 5 days (see taper instructions below)  From your responses in the eVisit questionnaire you describe inflammation in the upper respiratory tract which is causing a significant cough.  This is commonly called Bronchitis and has four common causes:   Allergies Viral Infections Acid Reflux Bacterial Infection Allergies, viruses and acid reflux are treated by controlling symptoms or eliminating the cause. An example might be a cough caused by taking certain blood pressure medications. You stop the cough by changing the medication. Another example might be a cough caused by acid reflux. Controlling the reflux helps control the cough.  USE OF BRONCHODILATOR ("RESCUE") INHALERS: There is a risk from using your bronchodilator too frequently.  The risk is that over-reliance on a medication which only relaxes the muscles surrounding the breathing tubes can reduce the effectiveness of medications prescribed to reduce swelling and congestion of the tubes themselves.  Although you feel brief relief from the bronchodilator inhaler, your asthma may actually be  worsening with the tubes becoming more swollen and filled with mucus.  This can delay other crucial treatments, such as oral steroid medications. If you need to use a bronchodilator inhaler daily, several times per day, you should discuss this with your provider.  There are probably better treatments that could be used to keep your asthma under control.     HOME CARE Only take medications as instructed by your medical team. Complete the entire course of an antibiotic. Drink plenty of fluids and get plenty of rest. Avoid close contacts especially the very young and the elderly Cover your mouth if you cough or cough into your sleeve. Always remember to wash your hands A steam or ultrasonic humidifier can help congestion.   GET HELP RIGHT AWAY IF: You develop worsening fever. You become short of breath You cough up blood. Your symptoms persist after you have completed your treatment plan MAKE SURE YOU  Understand these instructions. Will watch your condition. Will get help right away if you are not doing well or get worse.    Thank you for choosing an e-visit.  Your e-visit answers were reviewed by a board certified advanced clinical practitioner to complete your personal care plan. Depending upon the condition, your plan could have included both over the counter or prescription medications.  Please review your pharmacy choice. Make sure the pharmacy is open so you can pick up prescription now. If there is a problem, you may contact your provider through [employer] and have the prescription routed to another pharmacy.  Your safety is important to [place]. If you  have drug allergies check your prescription carefully.   For the next 24 hours you can use MyChart to ask questions about today's visit, request a non-urgent call back, or ask for a work or school excuse. You will get an email in the next two days asking about your experience. I hope that your e-visit has been valuable and will  speed your recovery.  I spent approximately 5 minutes reviewing the patient's history, current symptoms and coordinating their care today.

## 2024-04-09 ENCOUNTER — Other Ambulatory Visit (HOSPITAL_COMMUNITY): Payer: Self-pay | Admitting: Family Medicine

## 2024-04-09 DIAGNOSIS — Z1231 Encounter for screening mammogram for malignant neoplasm of breast: Secondary | ICD-10-CM

## 2024-04-22 ENCOUNTER — Ambulatory Visit (HOSPITAL_COMMUNITY)
Admission: RE | Admit: 2024-04-22 | Discharge: 2024-04-22 | Disposition: A | Discharge status: Home / Self Care | Payer: PRIVATE HEALTH INSURANCE | Source: Ambulatory Visit | Attending: Family Medicine | Admitting: Family Medicine

## 2024-04-22 DIAGNOSIS — Z1231 Encounter for screening mammogram for malignant neoplasm of breast: Secondary | ICD-10-CM | POA: Insufficient documentation

## 2024-09-01 ENCOUNTER — Other Ambulatory Visit: Payer: Self-pay

## 2024-09-01 ENCOUNTER — Emergency Department (HOSPITAL_COMMUNITY)
Admission: EM | Admit: 2024-09-01 | Discharge: 2024-09-01 | Discharge status: Against Medical Advice | Payer: PRIVATE HEALTH INSURANCE | Attending: Emergency Medicine | Admitting: Emergency Medicine

## 2024-09-01 ENCOUNTER — Encounter (HOSPITAL_COMMUNITY): Payer: Self-pay

## 2024-09-01 ENCOUNTER — Ambulatory Visit
Admission: EM | Admit: 2024-09-01 | Discharge: 2024-09-01 | Disposition: A | Discharge status: Home / Self Care | Payer: PRIVATE HEALTH INSURANCE | Attending: Nurse Practitioner | Admitting: Nurse Practitioner

## 2024-09-01 ENCOUNTER — Ambulatory Visit: Payer: PRIVATE HEALTH INSURANCE

## 2024-09-01 DIAGNOSIS — R0602 Shortness of breath: Secondary | ICD-10-CM

## 2024-09-01 DIAGNOSIS — Z5321 Procedure and treatment not carried out due to patient leaving prior to being seen by health care provider: Secondary | ICD-10-CM | POA: Diagnosis not present

## 2024-09-01 LAB — CBC
HCT: 40.5 % (ref 36.0–46.0)
Hemoglobin: 12.7 g/dL (ref 12.0–15.0)
MCH: 28.3 pg (ref 26.0–34.0)
MCHC: 31.4 g/dL (ref 30.0–36.0)
MCV: 90.4 fL (ref 80.0–100.0)
Platelets: 360 K/uL (ref 150–400)
RBC: 4.48 MIL/uL (ref 3.87–5.11)
RDW: 15 % (ref 11.5–15.5)
WBC: 6.2 K/uL (ref 4.0–10.5)
nRBC: 0 % (ref 0.0–0.2)

## 2024-09-01 LAB — BASIC METABOLIC PANEL WITH GFR
Anion gap: 10 (ref 5–15)
BUN: 12 mg/dL (ref 6–20)
CO2: 23 mmol/L (ref 22–32)
Calcium: 8.9 mg/dL (ref 8.9–10.3)
Chloride: 106 mmol/L (ref 98–111)
Creatinine, Ser: 0.75 mg/dL (ref 0.44–1.00)
GFR, Estimated: >60 mL/min (ref >60)
Glucose, Bld: 88 mg/dL (ref 70–99)
Potassium: 3.9 mmol/L (ref 3.5–5.1)
Sodium: 139 mmol/L (ref 135–145)

## 2024-09-01 LAB — POC SOFIA SARS ANTIGEN FIA: SARS Coronavirus 2 Ag: NEGATIVE

## 2024-09-01 MED ORDER — AMLODIPINE BESYLATE 5 MG PO TABS: 10.0000 mg | ORAL_TABLET | Freq: Once | ORAL | Status: DC

## 2024-09-01 MED ORDER — HYDROCHLOROTHIAZIDE 25 MG PO TABS: 25.0000 mg | ORAL_TABLET | Freq: Every day | ORAL | Status: DC

## 2024-09-01 NOTE — ED Notes (Addendum)
 Patient is being discharged from the Urgent Care and sent to the Emergency Department via POV . Per provider, patient is in need of higher level of care due to pleural effusion and SHOB. Patient is aware and verbalizes understanding of plan of care.  Vitals:   09/01/24 1030 09/01/24 1058  BP: (!) 208/104 (!) 173/76  Pulse: 90 73  Resp: 20   Temp: 98.1 F (36.7 C)   SpO2: 93% 97%

## 2024-09-01 NOTE — ED Notes (Signed)
 Obtained order for her home BP medication. Amlodipine  and hydrochlorothiazide .

## 2024-09-01 NOTE — ED Provider Notes (Signed)
 [facility]    CSN: [id number] Arrival date & time: 09/01/24  1021      History   Chief Complaint No chief complaint on file.   HPI Renee Vargas is a 63 y.o. female.   The history is provided by the patient.   Patient presents for complaints of shortness of breath.  Patient presents with her daughter.  Daughter reports that patient was displaying symptoms of shortness of breath when she was at a football game yesterday.  Patient states this morning when she woke up, she felt a rattling in her chest and since then has had difficulty taking in a deep breath.  She denies fever, chills, chest pain, abdominal pain, nausea, vomiting, or wheezing.  States that she does have a mild cough.  She denies prior history of heart disease or lung disease.  She also denies prior history of asthma.  Patient reports that she has not taken her blood pressure medication today.  Patient is noted to be markedly hypertensive in triage.  Past Medical History:  Diagnosis Date   Anemia    Anxiety    GERD (gastroesophageal reflux disease)    History of gout    Hypertension     Patient Active Problem List   Diagnosis Date Noted   Incisional hernia, without obstruction or gangrene 12/01/2023   Abdominal pain, epigastric 08/10/2023   History of adenomatous polyp of colon 08/10/2023   Special screening for malignant neoplasms, colon    Postoperative state 03/26/2014   Status post abdominal supracervical subtotal hysterectomy 03/12/2014   Fibroids, intramural 02/28/2014   Menorrhagia 02/28/2014   Fibroids 02/19/2014   Anemia 02/19/2014   Heavy menstrual bleeding 02/19/2014   Fibroid uterus 11/07/2013    Past Surgical History:  Procedure Laterality Date   ABDOMINAL HYSTERECTOMY N/A 03/12/2014   Procedure: SUPRACERVICAL HYSTERECTOMY;  Surgeon: Norleen LULLA Server, MD;  Location: [facility];  Service: Gynecology;  Laterality: N/A;   BIOPSY  09/18/2023   Procedure: BIOPSY;  Surgeon: Cindie Carlin POUR, DO;  Location: [facility];  Service: Endoscopy;;   COLONOSCOPY N/A 07/13/2017   Surgeon: Harvey Margo CROME, MD; 8 mm tubular adenoma, sigmoid and descending colon diverticulosis, external and internal hemorrhoids.  Recommended repeat in 5-10 years.   COLONOSCOPY WITH PROPOFOL  N/A 09/18/2023   Procedure: COLONOSCOPY WITH PROPOFOL ;  Surgeon: Cindie Carlin POUR, DO;  Location: [facility];  Service: Endoscopy;  Laterality: N/A;  845am, asa 3   ESOPHAGOGASTRODUODENOSCOPY (EGD) WITH PROPOFOL  N/A 09/18/2023   Procedure: ESOPHAGOGASTRODUODENOSCOPY (EGD) WITH PROPOFOL ;  Surgeon: Cindie Carlin POUR, DO;  Location: [facility];  Service: Endoscopy;  Laterality: N/A;   fatty mass removed Left    hip   POLYPECTOMY  09/18/2023   Procedure: POLYPECTOMY;  Surgeon: Cindie Carlin POUR, DO;  Location: [facility];  Service: Endoscopy;;   SALPINGOOPHORECTOMY Bilateral 03/12/2014   Procedure: BILATERAL SALPINGO OOPHORECTOMY;  Surgeon: Norleen LULLA Server, MD;  Location: [facility];  Service: Gynecology;  Laterality: Bilateral;   TUBAL LIGATION     XI ROBOTIC ASSISTED VENTRAL HERNIA N/A 12/01/2023   Procedure: XI ROBOTIC ASSISTED VENTRAL HERNIA REPAIR W/ MESH;  Surgeon: Mavis Anes, MD;  Location: [facility];  Service: General;  Laterality: N/A;    OB History   No obstetric history on file.      Home Medications    Prior to Admission medications   Medication Sig Start Date End Date Taking? Authorizing Provider  ALPRAZolam (XANAX) 0.5 MG tablet Take 0.5  mg by mouth 2 (two) times daily as needed for anxiety or sleep. 08/09/23   [provider]  amLODipine  (NORVASC ) 10 MG tablet Take 10 mg by mouth daily. 06/24/17   [provider]  escitalopram (LEXAPRO) 10 MG tablet Take 10 mg by mouth daily.    [provider]  hydrochlorothiazide  (HYDRODIURIL ) 25 MG tablet Take 25 mg by mouth daily.    [provider]  methocarbamol  (ROBAXIN -750) 750 MG tablet Take 1 tablet (750 mg  total) by mouth every 6 (six) hours as needed for muscle spasms. 12/01/23   Mavis Anes, MD  ondansetron  (ZOFRAN ) 4 MG tablet Take 1 tablet (4 mg total) by mouth every 8 (eight) hours as needed for nausea or vomiting. 11/08/23   Rudy Josette RAMAN, PA-C  oxyCODONE  (ROXICODONE ) 5 MG immediate release tablet Take 1 tablet (5 mg total) by mouth every 6 (six) hours as needed. 12/01/23 11/30/24  Mavis Anes, MD  pantoprazole  (PROTONIX ) 40 MG tablet Take 1 tablet (40 mg total) by mouth daily. 11/06/23   Rudy Josette RAMAN, PA-C  predniSONE  (DELTASONE ) 20 MG tablet Take 20 mg by mouth daily with breakfast. 11/17/23   [provider]    Family History Family History  Problem Relation Age of Onset   Hypertension Mother    Hypertension Sister    Colon cancer Neg Hx    Gastric cancer Neg Hx     Social History Social History   Tobacco Use   Smoking status: Never   Smokeless tobacco: Never  Vaping Use   Vaping status: Never Used  Substance Use Topics   Alcohol use: No   Drug use: No     Allergies   Aleve [naproxen sodium]   Review of Systems Review of Systems Per HPI  Physical Exam Triage Vital Signs ED Triage Vitals  Encounter Vitals Group     BP 09/01/24 1030 (!) 208/104     Girls Systolic BP Percentile --      Girls Diastolic BP Percentile --      Boys Systolic BP Percentile --      Boys Diastolic BP Percentile --      Pulse Rate 09/01/24 1030 90     Resp 09/01/24 1030 20     Temp 09/01/24 1030 98.1 F (36.7 C)     Temp src --      SpO2 09/01/24 1030 93 %     Weight --      Height --      Head Circumference --      Peak Flow --      Pain Score 09/01/24 1034 0     Pain Loc --      Pain Education --      Exclude from Growth Chart --    No data found.  Updated Vital Signs BP (!) 173/76 (BP Location: Right Wrist)   Pulse 73   Temp 98.1 F (36.7 C)   Resp 20   LMP 02/21/2014   SpO2 97%   Visual Acuity Right Eye Distance:   Left Eye Distance:    Bilateral Distance:    Right Eye Near:   Left Eye Near:    Bilateral Near:     Physical Exam Vitals and nursing note reviewed.  Constitutional:      General: She is not in acute distress.    Appearance: Normal appearance.  HENT:     Head: Normocephalic.  Eyes:     Extraocular Movements: Extraocular movements intact.  Conjunctiva/sclera: Conjunctivae normal.     Pupils: Pupils are equal, round, and reactive to light.  Cardiovascular:     Rate and Rhythm: Normal rate and regular rhythm.     Pulses: Normal pulses.     Heart sounds: Normal heart sounds.  Pulmonary:     Effort: Pulmonary effort is normal. No respiratory distress.     Breath sounds: Normal breath sounds. No stridor. No wheezing, rhonchi or rales.  Abdominal:     General: Bowel sounds are normal.     Palpations: Abdomen is soft.     Tenderness: There is no abdominal tenderness.  Musculoskeletal:     Cervical back: Normal range of motion.  Lymphadenopathy:     Cervical: No cervical adenopathy.  Skin:    General: Skin is warm and dry.  Neurological:     General: No focal deficit present.     Mental Status: She is alert and oriented to person, place, and time.  Psychiatric:        Mood and Affect: Mood normal.        Behavior: Behavior normal.      UC Treatments / Results  Labs (all labs ordered are listed, but only abnormal results are displayed) Labs Reviewed  POC SOFIA SARS ANTIGEN FIA    EKG: Sinus rhythm with arrhythmia, no STEMI.  Compared to EKGs dated 09/14/2023 and 03/10/2014.   Radiology DG Chest 2 View Result Date: 09/01/2024 EXAM: 2 VIEW(S) XRAY OF THE CHEST 09/01/2024 11:00:49 AM COMPARISON: None available. CLINICAL HISTORY: Shortness of breath x 1 day. Pt reports she felt a rattle in chest x 1 day. Woke up [facility], couldn't catch breath. Reports some cough and cannot take a full breath. FINDINGS: LUNGS AND PLEURA: No focal pulmonary opacity. No pulmonary edema. Small left pleural effusion.  No pneumothorax. HEART AND MEDIASTINUM: No acute abnormality of the cardiac and mediastinal silhouettes. BONES AND SOFT TISSUES: Mild multilevel degenerative changes of thoracic spine. No acute osseous abnormality. IMPRESSION: 1. Small left pleural effusion. Electronically signed by: Lonni Necessary MD 09/01/2024 11:17 AM EDT RP Workstation: [id number]    Procedures Procedures (including critical care time)  Medications Ordered in UC Medications - No data to display  Initial Impression / Assessment and Plan / UC Course  I have reviewed the triage vital signs and the nursing notes.  Pertinent labs & imaging results that were available during my care of the patient were reviewed by me and considered in my medical decision making (see chart for details).  Patient presents with a 1 day history of shortness of breath.  Patient denies chest pain, or wheezing, but states that she has difficulty taking in a deep breath.  Patient also states that she has a mild cough.  COVID test was negative, EKG shows sinus rhythm with arrhythmia, chest x-ray does show a small left pleural effusion.  Patient denies any past cardiac history or recent pneumonia.  There is concern as to why she may have a pleural effusion.  Given the patient's current symptoms and finding on chest x-ray, recommend the patient be seen in the emergency department for further evaluation.  Discussing with patient, and she is in agreement.  Recheck of her blood pressure prior to discharge shows decreased down to 173/76.  Patient's vital signs are stable, she is in no acute distress.  Patient was in agreement with this recommendation and verbalizes understanding.  Patient discharged to the emergency department.  Patient ambulatory at discharge.   Final Clinical Impressions(s) /  UC Diagnoses   Final diagnoses:  Shortness of breath   Discharge Instructions   None    ED Prescriptions   None    PDMP not reviewed this encounter.    Gilmer Etta PARAS, NP 09/01/24 1140

## 2024-09-01 NOTE — ED Triage Notes (Signed)
 Pt reports she felt a rattle in chest x 1 day. Woke up [facility], couldn't catch breath. Reports some cough and cannot take a full breath.

## 2024-09-01 NOTE — ED Notes (Signed)
 Pt deciding to leave. Daughter told this staff member that she would just take her somewhere. Sort RN just got order to give meds. Pt sated Thank you. This staff member apologized for our long wait and advised her if her s/s got worse to come back soon or call 911.

## 2024-09-01 NOTE — ED Triage Notes (Signed)
 Pt sent from UC due to SOB since yesterday. Daughter reports chest xray showed fluid on lungs. Pt denies pain.

## 2024-09-01 NOTE — Discharge Instructions (Addendum)
Please go to the emergency department for further evaluation.

## 2024-11-01 ENCOUNTER — Telehealth: Payer: PRIVATE HEALTH INSURANCE | Admitting: Physician Assistant

## 2024-11-01 DIAGNOSIS — J988 Other specified respiratory disorders: Secondary | ICD-10-CM

## 2024-11-01 DIAGNOSIS — B9689 Other specified bacterial agents as the cause of diseases classified elsewhere: Secondary | ICD-10-CM

## 2024-11-01 MED ORDER — BENZONATATE 100 MG PO CAPS: 100.0000 mg | ORAL_CAPSULE | Freq: Three times a day (TID) | ORAL | 0 refills | Status: AC | PRN

## 2024-11-01 MED ORDER — DOXYCYCLINE HYCLATE 100 MG PO TABS: 100.0000 mg | ORAL_TABLET | Freq: Two times a day (BID) | ORAL | 0 refills | Status: AC

## 2024-11-01 NOTE — Progress Notes (Signed)
 We are sorry that you are not feeling well.  Here is how we plan to help!  Based on your presentation I believe you most likely have A cough due to bacteria.  When patients have a fever and a productive cough with a change in color or increased sputum production, we are concerned about bacterial bronchitis.  If left untreated it can progress to pneumonia.  If your symptoms do not improve with your treatment plan it is important that you contact your provider.     I have prescribed Doxycycline  100 mg twice a day for 7 days     In addition you may use A prescription cough medication called Tessalon  Perles 100mg . You may take 1-2 capsules every 8 hours as needed for your cough.   From your responses in the eVisit questionnaire you describe inflammation in the upper respiratory tract which is causing a significant cough.  This is commonly called Bronchitis and has four common causes:   Allergies Viral Infections Acid Reflux Bacterial Infection Allergies, viruses and acid reflux are treated by controlling symptoms or eliminating the cause. An example might be a cough caused by taking certain blood pressure medications. You stop the cough by changing the medication. Another example might be a cough caused by acid reflux. Controlling the reflux helps control the cough.  USE OF BRONCHODILATOR (RESCUE) INHALERS: There is a risk from using your bronchodilator too frequently.  The risk is that over-reliance on a medication which only relaxes the muscles surrounding the breathing tubes can reduce the effectiveness of medications prescribed to reduce swelling and congestion of the tubes themselves.  Although you feel brief relief from the bronchodilator inhaler, your asthma may actually be worsening with the tubes becoming more swollen and filled with mucus.  This can delay other crucial treatments, such as oral steroid medications. If you need to use a bronchodilator inhaler daily, several times per day, you  should discuss this with your provider.  There are probably better treatments that could be used to keep your asthma under control.     HOME CARE Only take medications as instructed by your medical team. Complete the entire course of an antibiotic. Drink plenty of fluids and get plenty of rest. Avoid close contacts especially the very young and the elderly Cover your mouth if you cough or cough into your sleeve. Always remember to wash your hands A steam or ultrasonic humidifier can help congestion.   GET HELP RIGHT AWAY IF: You develop worsening fever. You become short of breath You cough up blood. Your symptoms persist after you have completed your treatment plan MAKE SURE YOU  Understand these instructions. Will watch your condition. Will get help right away if you are not doing well or get worse.  Your e-visit answers were reviewed by a board certified advanced clinical practitioner to complete your personal care plan.  Depending on the condition, your plan could have included both over the counter or prescription medications. If there is a problem please reply  once you have received a response from your provider. Your safety is important to us .  If you have drug allergies check your prescription carefully.    You can use MyChart to ask questions about today's visit, request a non-urgent call back, or ask for a work or school excuse for 24 hours related to this e-Visit. If it has been greater than 24 hours you will need to follow up with your provider, or enter a new e-Visit to address those  concerns. You will get an e-mail in the next two days asking about your experience.  I hope that your e-visit has been valuable and will speed your recovery. Thank you for using e-visits.   I have spent 5 minutes in review of e-visit questionnaire, review and updating patient chart, medical decision making and response to patient.   Donyel Castagnola, PA-C

## 2024-11-19 ENCOUNTER — Telehealth: Payer: PRIVATE HEALTH INSURANCE | Admitting: Family Medicine

## 2024-11-19 DIAGNOSIS — J329 Chronic sinusitis, unspecified: Secondary | ICD-10-CM

## 2024-11-19 NOTE — Progress Notes (Signed)
  Because you have had antibiotics and did not improve you will need to be seen in person for this. We feel your condition warrants further evaluation and recommend that you be seen in a face-to-face visit.   NOTE: There will be NO CHARGE for this E-Visit   If you are having a true medical emergency, please call 911.
